# Patient Record
Sex: Male | Born: 1980 | Race: Black or African American | Hispanic: No | Marital: Single | Smoking: Never smoker
Health system: Southern US, Community
[De-identification: ages and names within clinical notes are randomized; demographics above are authoritative.]

## PROBLEM LIST (undated history)

## (undated) DIAGNOSIS — I10 Essential (primary) hypertension: Secondary | ICD-10-CM

## (undated) HISTORY — PX: SHOULDER SURGERY: SHX246

---

## 2004-03-02 ENCOUNTER — Emergency Department (HOSPITAL_COMMUNITY): Admission: EM | Admit: 2004-03-02 | Discharge: 2004-03-02 | Payer: Self-pay | Admitting: Emergency Medicine

## 2004-11-06 ENCOUNTER — Emergency Department (HOSPITAL_COMMUNITY): Admission: EM | Admit: 2004-11-06 | Discharge: 2004-11-06 | Payer: Self-pay | Admitting: Emergency Medicine

## 2010-07-07 ENCOUNTER — Emergency Department (HOSPITAL_COMMUNITY): Admission: EM | Admit: 2010-07-07 | Discharge: 2010-07-07 | Payer: Self-pay | Admitting: Emergency Medicine

## 2010-08-16 ENCOUNTER — Emergency Department (HOSPITAL_COMMUNITY): Admission: EM | Admit: 2010-08-16 | Discharge: 2010-01-08 | Payer: Self-pay | Admitting: Emergency Medicine

## 2010-11-21 LAB — POCT I-STAT, CHEM 8
Chloride: 103 mEq/L (ref 96–112)
Glucose, Bld: 94 mg/dL (ref 70–99)
HCT: 46 % (ref 39.0–52.0)
Potassium: 4.5 mEq/L (ref 3.5–5.1)
Sodium: 140 mEq/L (ref 135–145)

## 2010-11-21 LAB — CBC
HCT: 42.1 % (ref 39.0–52.0)
Hemoglobin: 14.3 g/dL (ref 13.0–17.0)
MCH: 27.8 pg (ref 26.0–34.0)
MCHC: 34 g/dL (ref 30.0–36.0)
MCV: 81.9 fL (ref 78.0–100.0)
RDW: 13 % (ref 11.5–15.5)
WBC: 5.4 10*3/uL (ref 4.0–10.5)

## 2010-11-21 LAB — URINALYSIS, ROUTINE W REFLEX MICROSCOPIC
Glucose, UA: NEGATIVE mg/dL
Hgb urine dipstick: NEGATIVE
Ketones, ur: 15 mg/dL — AB
Specific Gravity, Urine: 1.027 (ref 1.005–1.030)
pH: 8 (ref 5.0–8.0)

## 2012-09-19 ENCOUNTER — Encounter (HOSPITAL_COMMUNITY): Payer: Self-pay | Admitting: Emergency Medicine

## 2012-09-19 DIAGNOSIS — N419 Inflammatory disease of prostate, unspecified: Secondary | ICD-10-CM | POA: Insufficient documentation

## 2012-09-19 DIAGNOSIS — I10 Essential (primary) hypertension: Secondary | ICD-10-CM | POA: Insufficient documentation

## 2012-09-19 DIAGNOSIS — R350 Frequency of micturition: Secondary | ICD-10-CM | POA: Insufficient documentation

## 2012-09-19 LAB — CBC WITH DIFFERENTIAL/PLATELET
Basophils Absolute: 0 10*3/uL (ref 0.0–0.1)
Eosinophils Relative: 10 % — ABNORMAL HIGH (ref 0–5)
HCT: 40.5 % (ref 39.0–52.0)
Lymphocytes Relative: 56 % — ABNORMAL HIGH (ref 12–46)
Lymphs Abs: 2.8 10*3/uL (ref 0.7–4.0)
Monocytes Relative: 7 % (ref 3–12)
Platelets: 235 10*3/uL (ref 150–400)
RBC: 4.9 MIL/uL (ref 4.22–5.81)
WBC: 5.1 10*3/uL (ref 4.0–10.5)

## 2012-09-19 LAB — COMPREHENSIVE METABOLIC PANEL
ALT: 35 U/L (ref 0–53)
AST: 60 U/L — ABNORMAL HIGH (ref 0–37)
Albumin: 3.8 g/dL (ref 3.5–5.2)
BUN: 14 mg/dL (ref 6–23)
CO2: 29 mEq/L (ref 19–32)
Calcium: 9.6 mg/dL (ref 8.4–10.5)
Chloride: 100 mEq/L (ref 96–112)
GFR calc non Af Amer: 50 mL/min — ABNORMAL LOW (ref >90)
Total Bilirubin: 0.4 mg/dL (ref 0.3–1.2)
Total Protein: 7.3 g/dL (ref 6.0–8.3)

## 2012-09-19 LAB — URINE MICROSCOPIC-ADD ON

## 2012-09-19 LAB — URINALYSIS, ROUTINE W REFLEX MICROSCOPIC
Glucose, UA: NEGATIVE mg/dL
Hgb urine dipstick: NEGATIVE
Ketones, ur: 15 mg/dL — AB
Protein, ur: 30 mg/dL — AB

## 2012-09-19 NOTE — ED Notes (Signed)
PT. REPORTS URINARY FREQUENCY FOR SEVERAL DAYS AND  BLOOD IN STOOLS ONSET 3 WEEKS AGO.

## 2012-09-20 ENCOUNTER — Emergency Department (HOSPITAL_COMMUNITY)
Admission: EM | Admit: 2012-09-20 | Discharge: 2012-09-20 | Disposition: A | Discharge status: Home / Self Care | Payer: Managed Care, Other (non HMO) | Attending: Emergency Medicine | Admitting: Emergency Medicine

## 2012-09-20 DIAGNOSIS — N419 Inflammatory disease of prostate, unspecified: Secondary | ICD-10-CM

## 2012-09-20 HISTORY — DX: Essential (primary) hypertension: I10

## 2012-09-20 LAB — OCCULT BLOOD, POC DEVICE: Fecal Occult Bld: NEGATIVE

## 2012-09-20 MED ORDER — CIPROFLOXACIN HCL 500 MG PO TABS: 500.0000 mg | ORAL_TABLET | Freq: Two times a day (BID) | ORAL | Status: DC

## 2012-09-20 MED ORDER — IBUPROFEN 800 MG PO TABS: 800.0000 mg | ORAL_TABLET | Freq: Three times a day (TID) | ORAL | Status: DC

## 2012-09-20 MED ORDER — CIPROFLOXACIN HCL 500 MG PO TABS
500.0000 mg | ORAL_TABLET | Freq: Once | ORAL | Status: AC
  Administered 2012-09-20: 500 mg via ORAL
  Filled 2012-09-20: qty 1

## 2012-09-20 MED ORDER — SODIUM CHLORIDE 0.9 % IV BOLUS (SEPSIS)
1000.0000 mL | Freq: Once | INTRAVENOUS | Status: AC
  Administered 2012-09-20: 1000 mL via INTRAVENOUS

## 2012-09-20 NOTE — ED Provider Notes (Signed)
History     CSN: [id number]  Arrival date & time 09/19/12  2244   First MD Initiated Contact with Patient 09/20/12 0234      Chief Complaint  Patient presents with  . Hematochezia  . Urinary Frequency    (Consider location/radiation/quality/duration/timing/severity/associated sxs/prior treatment) HPI History provided by patient. The last few days has been having urinary frequency and painful bowel movements with pressure around what he describes as his prostate region. has also noticed some spotting bright red blood per rectum with wiping. No black or tarry stools. No blood thinners. No fevers or chills. No painful urination.   Patient was diagnosed with prostatitis at age 7 and did see a urologist at that time. Symptoms moderate in severity. No abdominal pain. No nausea vomiting. Has had some recent constipation but no diarrhea. Past Medical History  Diagnosis Date  . Hypertension     History reviewed. No pertinent past surgical history.  No family history on file.  History  Substance Use Topics  . Smoking status: Never Smoker   . Smokeless tobacco: Not on file  . Alcohol Use: No      Review of Systems  Constitutional: Negative for fever and chills.  HENT: Negative for neck pain and neck stiffness.   Eyes: Negative for pain.  Respiratory: Negative for shortness of breath.   Cardiovascular: Negative for chest pain.  Gastrointestinal: Positive for blood in stool. Negative for vomiting and abdominal pain.  Genitourinary: Negative for dysuria.  Musculoskeletal: Negative for back pain.  Skin: Negative for rash.  Neurological: Negative for headaches.  All other systems reviewed and are negative.    Allergies  Review of patient's allergies indicates no known allergies.  Home Medications   Current Outpatient Rx  Name  Route  Sig  Dispense  Refill  . CIPROFLOXACIN HCL 500 MG PO TABS   Oral   Take 1 tablet (500 mg total) by mouth 2 (two) times daily.   28 tablet   1   . IBUPROFEN 800 MG PO TABS   Oral   Take 1 tablet (800 mg total) by mouth 3 (three) times daily.   21 tablet   0     BP 135/66  Pulse 61  Temp 98.5 F (36.9 C) (Oral)  Resp 17  SpO2 99%  Physical Exam  Constitutional: He is oriented to person, place, and time. He appears well-developed and well-nourished.  HENT:  Head: Normocephalic and atraumatic.  Eyes: EOM are normal. Pupils are equal, round, and reactive to light.  Neck: Neck supple.  Cardiovascular: Normal rate, regular rhythm and intact distal pulses.   Pulmonary/Chest: Effort normal and breath sounds normal. No respiratory distress.  Abdominal: Soft. Bowel sounds are normal. He exhibits no distension and no mass. There is no tenderness. There is no rebound and no guarding.  Genitourinary: Guaiac negative stool.       Tender prostate, brown stool, no evidence of fissure or active bleeding, no external hemorrhoids  Musculoskeletal: Normal range of motion. He exhibits no edema.  Neurological: He is alert and oriented to person, place, and time.  Skin: Skin is warm and dry.    ED Course  Procedures (including critical care time)  Results for orders placed during the hospital encounter of 09/20/12  CBC WITH DIFFERENTIAL      Component Value Range   WBC 5.1  4.0 - 10.5 K/uL   RBC 4.90  4.22 - 5.81 MIL/uL   Hemoglobin 13.2  13.0 - 17.0 g/dL  HCT 40.5  39.0 - 52.0 %   MCV 82.7  78.0 - 100.0 fL   MCH 26.9  26.0 - 34.0 pg   MCHC 32.6  30.0 - 36.0 g/dL   RDW 14.7  82.9 - 56.2 %   Platelets 235  150 - 400 K/uL   Neutrophils Relative 26 (*) 43 - 77 %   Neutro Abs 1.3 (*) 1.7 - 7.7 K/uL   Lymphocytes Relative 56 (*) 12 - 46 %   Lymphs Abs 2.8  0.7 - 4.0 K/uL   Monocytes Relative 7  3 - 12 %   Monocytes Absolute 0.4  0.1 - 1.0 K/uL   Eosinophils Relative 10 (*) 0 - 5 %   Eosinophils Absolute 0.5  0.0 - 0.7 K/uL   Basophils Relative 1  0 - 1 %   Basophils Absolute 0.0  0.0 - 0.1 K/uL  COMPREHENSIVE METABOLIC  PANEL      Component Value Range   Sodium 138  135 - 145 mEq/L   Potassium 4.9  3.5 - 5.1 mEq/L   Chloride 100  96 - 112 mEq/L   CO2 29  19 - 32 mEq/L   Glucose, Bld 105 (*) 70 - 99 mg/dL   BUN 14  6 - 23 mg/dL   Creatinine, Ser 1.30 (*) 0.50 - 1.35 mg/dL   Calcium 9.6  8.4 - 86.5 mg/dL   Total Protein 7.3  6.0 - 8.3 g/dL   Albumin 3.8  3.5 - 5.2 g/dL   AST 60 (*) 0 - 37 U/L   ALT 35  0 - 53 U/L   Alkaline Phosphatase 73  39 - 117 U/L   Total Bilirubin 0.4  0.3 - 1.2 mg/dL   GFR calc non Af Amer 50 (*) >90 mL/min   GFR calc Af Amer 58 (*) >90 mL/min  URINALYSIS, ROUTINE W REFLEX MICROSCOPIC      Component Value Range   Color, Urine YELLOW  YELLOW   APPearance CLEAR  CLEAR   Specific Gravity, Urine 1.037 (*) 1.005 - 1.030   pH 6.5  5.0 - 8.0   Glucose, UA NEGATIVE  NEGATIVE mg/dL   Hgb urine dipstick NEGATIVE  NEGATIVE   Bilirubin Urine NEGATIVE  NEGATIVE   Ketones, ur 15 (*) NEGATIVE mg/dL   Protein, ur 30 (*) NEGATIVE mg/dL   Urobilinogen, UA 1.0  0.0 - 1.0 mg/dL   Nitrite NEGATIVE  NEGATIVE   Leukocytes, UA NEGATIVE  NEGATIVE  URINE MICROSCOPIC-ADD ON      Component Value Range   Squamous Epithelial / LPF RARE  RARE   WBC, UA 0-2  <3 WBC/hpf   RBC / HPF 0-2  <3 RBC/hpf   Bacteria, UA RARE  RARE  OCCULT BLOOD, POC DEVICE      Component Value Range   Fecal Occult Bld NEGATIVE  NEGATIVE     1. Prostatitis    IV fluids and Cipro provided  MDM   Presentation suggests prostatitis. Plan treatment with antibiotics and followup urology. Patient also given referral to GI with history of rectal bleeding.  Strict return precautions verbalizes understood. Labs urinalysis reviewed. Vital signs and nursing notes reviewed        Sunnie Nielsen, MD 09/20/12 [id number]

## 2014-04-07 ENCOUNTER — Ambulatory Visit (INDEPENDENT_AMBULATORY_CARE_PROVIDER_SITE_OTHER): Payer: Managed Care, Other (non HMO) | Admitting: Family Medicine

## 2014-04-07 VITALS — BP 140/84 | HR 75 | Temp 98.1°F | Resp 16 | Ht 75.5 in | Wt 205.2 lb

## 2014-04-07 DIAGNOSIS — Z8042 Family history of malignant neoplasm of prostate: Secondary | ICD-10-CM

## 2014-04-07 DIAGNOSIS — N509 Disorder of male genital organs, unspecified: Secondary | ICD-10-CM

## 2014-04-07 DIAGNOSIS — N410 Acute prostatitis: Secondary | ICD-10-CM

## 2014-04-07 DIAGNOSIS — N411 Chronic prostatitis: Secondary | ICD-10-CM

## 2014-04-07 DIAGNOSIS — R102 Pelvic and perineal pain unspecified side: Secondary | ICD-10-CM

## 2014-04-07 DIAGNOSIS — R300 Dysuria: Secondary | ICD-10-CM

## 2014-04-07 MED ORDER — CIPROFLOXACIN HCL 500 MG PO TABS: 500.0000 mg | ORAL_TABLET | Freq: Two times a day (BID) | ORAL | Status: DC

## 2014-04-07 NOTE — Patient Instructions (Signed)

## 2014-04-07 NOTE — Progress Notes (Signed)
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 He's had a history of prostate infections and he thinks he has one now. He's been having intermittent dysuria, incomplete emptying, perineal pain and a kind of clear discharge.  And he's single and says he is "not that sexually active: "  Patient has a family history of prostate cancer (father had it at age 35 or so)  Objective: Healthy appearing gentleman in no acute distress Genitalia: Normal circumcised male with normal testes no hernia. He's nontender in the perineum with palpation. Rectal exam reveals a boggy prostate which has no nodules.  Assessment: Acute prostatitis - Plan: ciprofloxacin (CIPRO) 500 MG tablet, GC probe amplification, urine, PSA  Perineal pain in male - Plan: PSA  Dysuria - Plan: PSA  FH: prostate cancer  Signed, Elvina SidleKurt Damoney Julia, MD

## 2014-04-09 LAB — PSA: PSA: 0.95 ng/mL (ref <=4.00)

## 2014-04-09 LAB — GC PROBE AMPLIFICATION, URINE: GC Probe Amp, Urine: NEGATIVE

## 2014-07-03 ENCOUNTER — Encounter (HOSPITAL_COMMUNITY): Payer: Self-pay | Admitting: Emergency Medicine

## 2014-07-03 ENCOUNTER — Emergency Department (HOSPITAL_COMMUNITY)
Admission: EM | Admit: 2014-07-03 | Discharge: 2014-07-03 | Disposition: A | Discharge status: Home / Self Care | Payer: Managed Care, Other (non HMO) | Attending: Emergency Medicine | Admitting: Emergency Medicine

## 2014-07-03 ENCOUNTER — Emergency Department (HOSPITAL_COMMUNITY): Payer: Managed Care, Other (non HMO)

## 2014-07-03 DIAGNOSIS — Y92410 Unspecified street and highway as the place of occurrence of the external cause: Secondary | ICD-10-CM | POA: Insufficient documentation

## 2014-07-03 DIAGNOSIS — S7001XA Contusion of right hip, initial encounter: Secondary | ICD-10-CM | POA: Diagnosis not present

## 2014-07-03 DIAGNOSIS — Y9389 Activity, other specified: Secondary | ICD-10-CM | POA: Insufficient documentation

## 2014-07-03 DIAGNOSIS — I10 Essential (primary) hypertension: Secondary | ICD-10-CM | POA: Insufficient documentation

## 2014-07-03 DIAGNOSIS — S4991XA Unspecified injury of right shoulder and upper arm, initial encounter: Secondary | ICD-10-CM | POA: Diagnosis present

## 2014-07-03 DIAGNOSIS — R52 Pain, unspecified: Secondary | ICD-10-CM

## 2014-07-03 DIAGNOSIS — T148XXA Other injury of unspecified body region, initial encounter: Secondary | ICD-10-CM

## 2014-07-03 DIAGNOSIS — S40011A Contusion of right shoulder, initial encounter: Secondary | ICD-10-CM | POA: Insufficient documentation

## 2014-07-03 DIAGNOSIS — W19XXXA Unspecified fall, initial encounter: Secondary | ICD-10-CM

## 2014-07-03 LAB — I-STAT CHEM 8, ED
BUN: 12 mg/dL (ref 6–23)
CALCIUM ION: 1.11 mmol/L — AB (ref 1.12–1.23)
CHLORIDE: 102 meq/L (ref 96–112)
CREATININE: 1.4 mg/dL — AB (ref 0.50–1.35)
GLUCOSE: 96 mg/dL (ref 70–99)
HEMATOCRIT: 43 % (ref 39.0–52.0)
HEMOGLOBIN: 14.6 g/dL (ref 13.0–17.0)
Potassium: 3.7 mEq/L (ref 3.7–5.3)
SODIUM: 139 meq/L (ref 137–147)
TCO2: 25 mmol/L (ref 0–100)

## 2014-07-03 MED ORDER — CYCLOBENZAPRINE HCL 10 MG PO TABS: 10.0000 mg | ORAL_TABLET | Freq: Two times a day (BID) | ORAL | Status: DC | PRN
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
## 2014-07-03 NOTE — ED Notes (Signed)
Pt ambulatory to room with slow, steady gait.

## 2014-07-03 NOTE — ED Notes (Addendum)
Pt sitting in chair in normal clothing. Complaining of pain 6/10 to right hip and shoulder, also left hand. Pt has steady gait and no problems with walking. No pain upon standing up.

## 2014-07-03 NOTE — ED Notes (Signed)
Pt given discharge instructions. Pt verbalized understanding.

## 2016-01-03 ENCOUNTER — Other Ambulatory Visit: Payer: Self-pay | Admitting: Orthopedic Surgery

## 2016-01-03 DIAGNOSIS — M25511 Pain in right shoulder: Secondary | ICD-10-CM

## 2016-01-10 ENCOUNTER — Ambulatory Visit
Admission: RE | Admit: 2016-01-10 | Discharge: 2016-01-10 | Disposition: A | Discharge status: Home / Self Care | Payer: Worker's Compensation | Source: Ambulatory Visit | Attending: Orthopedic Surgery | Admitting: Orthopedic Surgery

## 2016-01-10 DIAGNOSIS — M25511 Pain in right shoulder: Secondary | ICD-10-CM

## 2017-04-28 ENCOUNTER — Encounter (HOSPITAL_COMMUNITY): Payer: Self-pay

## 2017-04-28 ENCOUNTER — Inpatient Hospital Stay (HOSPITAL_COMMUNITY)
Admission: EM | Admit: 2017-04-28 | Discharge: 2017-05-01 | DRG: 443 | Disposition: A | Discharge status: Home / Self Care | Payer: No Typology Code available for payment source | Attending: General Surgery | Admitting: General Surgery

## 2017-04-28 ENCOUNTER — Inpatient Hospital Stay (HOSPITAL_COMMUNITY): Payer: No Typology Code available for payment source

## 2017-04-28 ENCOUNTER — Emergency Department (HOSPITAL_COMMUNITY): Payer: No Typology Code available for payment source

## 2017-04-28 DIAGNOSIS — I10 Essential (primary) hypertension: Secondary | ICD-10-CM | POA: Diagnosis present

## 2017-04-28 DIAGNOSIS — S5001XA Contusion of right elbow, initial encounter: Secondary | ICD-10-CM | POA: Diagnosis present

## 2017-04-28 DIAGNOSIS — S80211A Abrasion, right knee, initial encounter: Secondary | ICD-10-CM | POA: Diagnosis present

## 2017-04-28 DIAGNOSIS — S36113A Laceration of liver, unspecified degree, initial encounter: Secondary | ICD-10-CM | POA: Diagnosis present

## 2017-04-28 DIAGNOSIS — S80212A Abrasion, left knee, initial encounter: Secondary | ICD-10-CM | POA: Diagnosis present

## 2017-04-28 DIAGNOSIS — R1011 Right upper quadrant pain: Secondary | ICD-10-CM | POA: Diagnosis present

## 2017-04-28 DIAGNOSIS — R51 Headache: Secondary | ICD-10-CM | POA: Diagnosis present

## 2017-04-28 DIAGNOSIS — S60212A Contusion of left wrist, initial encounter: Secondary | ICD-10-CM | POA: Diagnosis present

## 2017-04-28 DIAGNOSIS — N289 Disorder of kidney and ureter, unspecified: Secondary | ICD-10-CM | POA: Diagnosis present

## 2017-04-28 DIAGNOSIS — T07XXXA Unspecified multiple injuries, initial encounter: Secondary | ICD-10-CM

## 2017-04-28 DIAGNOSIS — M25521 Pain in right elbow: Secondary | ICD-10-CM | POA: Diagnosis present

## 2017-04-28 DIAGNOSIS — R740 Nonspecific elevation of levels of transaminase and lactic acid dehydrogenase [LDH]: Secondary | ICD-10-CM | POA: Diagnosis present

## 2017-04-28 DIAGNOSIS — R7989 Other specified abnormal findings of blood chemistry: Secondary | ICD-10-CM | POA: Diagnosis present

## 2017-04-28 DIAGNOSIS — R0789 Other chest pain: Secondary | ICD-10-CM | POA: Diagnosis present

## 2017-04-28 DIAGNOSIS — H1131 Conjunctival hemorrhage, right eye: Secondary | ICD-10-CM | POA: Diagnosis present

## 2017-04-28 LAB — CBC
HCT: 43.1 % (ref 39.0–52.0)
Hemoglobin: 14.8 g/dL (ref 13.0–17.0)
MCH: 27.2 pg (ref 26.0–34.0)
MCHC: 34.3 g/dL (ref 30.0–36.0)
MCV: 79.2 fL (ref 78.0–100.0)
PLATELETS: 231 10*3/uL (ref 150–400)
RBC: 5.44 MIL/uL (ref 4.22–5.81)
RDW: 13.1 % (ref 11.5–15.5)
WBC: 9 10*3/uL (ref 4.0–10.5)

## 2017-04-28 LAB — COMPREHENSIVE METABOLIC PANEL
ALK PHOS: 51 U/L (ref 38–126)
ALT: 167 U/L — ABNORMAL HIGH (ref 17–63)
ANION GAP: 10 (ref 5–15)
AST: 172 U/L — AB (ref 15–41)
Albumin: 4 g/dL (ref 3.5–5.0)
BILIRUBIN TOTAL: 1.1 mg/dL (ref 0.3–1.2)
BUN: 13 mg/dL (ref 6–20)
CALCIUM: 9.1 mg/dL (ref 8.9–10.3)
CO2: 26 mmol/L (ref 22–32)
CREATININE: 1.61 mg/dL — AB (ref 0.61–1.24)
Chloride: 101 mmol/L (ref 101–111)
GFR calc Af Amer: >60 mL/min (ref >60)
GFR, EST NON AFRICAN AMERICAN: 53 mL/min — AB (ref >60)
GLUCOSE: 114 mg/dL — AB (ref 65–99)
POTASSIUM: 3.1 mmol/L — AB (ref 3.5–5.1)
Sodium: 137 mmol/L (ref 135–145)
TOTAL PROTEIN: 7.2 g/dL (ref 6.5–8.1)

## 2017-04-28 LAB — URINALYSIS, ROUTINE W REFLEX MICROSCOPIC
Bilirubin Urine: NEGATIVE
GLUCOSE, UA: NEGATIVE mg/dL
Hgb urine dipstick: NEGATIVE
KETONES UR: NEGATIVE mg/dL
LEUKOCYTES UA: NEGATIVE
NITRITE: NEGATIVE
PROTEIN: NEGATIVE mg/dL
Specific Gravity, Urine: 1.038 — ABNORMAL HIGH (ref 1.005–1.030)
pH: 7 (ref 5.0–8.0)

## 2017-04-28 LAB — TYPE AND SCREEN
ABO/RH(D): B NEG
Antibody Screen: NEGATIVE

## 2017-04-28 LAB — I-STAT CHEM 8, ED
BUN: 16 mg/dL (ref 6–20)
CALCIUM ION: 1.12 mmol/L — AB (ref 1.15–1.40)
CHLORIDE: 99 mmol/L — AB (ref 101–111)
CREATININE: 1.6 mg/dL — AB (ref 0.61–1.24)
Glucose, Bld: 115 mg/dL — ABNORMAL HIGH (ref 65–99)
HCT: 46 % (ref 39.0–52.0)
Hemoglobin: 15.6 g/dL (ref 13.0–17.0)
Potassium: 3 mmol/L — ABNORMAL LOW (ref 3.5–5.1)
Sodium: 139 mmol/L (ref 135–145)
TCO2: 26 mmol/L (ref 0–100)

## 2017-04-28 LAB — PROTIME-INR
INR: 1.06
PROTHROMBIN TIME: 13.8 s (ref 11.4–15.2)

## 2017-04-28 LAB — I-STAT CG4 LACTIC ACID, ED: LACTIC ACID, VENOUS: 3.09 mmol/L — AB (ref 0.5–1.9)

## 2017-04-28 MED ORDER — SODIUM CHLORIDE 0.9 % IV BOLUS (SEPSIS)
1000.0000 mL | Freq: Once | INTRAVENOUS | Status: AC
  Administered 2017-04-28: 1000 mL via INTRAVENOUS

## 2017-04-28 MED ORDER — IOPAMIDOL (ISOVUE-300) INJECTION 61%
INTRAVENOUS | Status: AC
  Administered 2017-04-28: 100 mL via INTRAVENOUS
  Filled 2017-04-28: qty 100
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
## 2017-04-28 NOTE — ED Notes (Signed)
Admitting at bedside 
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
## 2017-04-28 NOTE — ED Notes (Signed)
Patient taken to CT.
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
## 2017-04-29 LAB — CBC
HCT: 39.9 % (ref 39.0–52.0)
HEMATOCRIT: 40.2 % (ref 39.0–52.0)
HEMATOCRIT: 40.1 % (ref 39.0–52.0)
HEMOGLOBIN: 13.4 g/dL (ref 13.0–17.0)
Hemoglobin: 13.8 g/dL (ref 13.0–17.0)
Hemoglobin: 13.3 g/dL (ref 13.0–17.0)
MCH: 27.3 pg (ref 26.0–34.0)
MCH: 26.7 pg (ref 26.0–34.0)
MCH: 26.8 pg (ref 26.0–34.0)
MCHC: 34.3 g/dL (ref 30.0–36.0)
MCHC: 33.3 g/dL (ref 30.0–36.0)
MCHC: 33.4 g/dL (ref 30.0–36.0)
MCV: 79.4 fL (ref 78.0–100.0)
MCV: 80.1 fL (ref 78.0–100.0)
MCV: 80.2 fL (ref 78.0–100.0)
PLATELETS: 193 10*3/uL (ref 150–400)
PLATELETS: 194 10*3/uL (ref 150–400)
Platelets: 202 10*3/uL (ref 150–400)
RBC: 5.06 MIL/uL (ref 4.22–5.81)
RBC: 4.98 MIL/uL (ref 4.22–5.81)
RBC: 5 MIL/uL (ref 4.22–5.81)
RDW: 13.2 % (ref 11.5–15.5)
RDW: 13.2 % (ref 11.5–15.5)
RDW: 13.2 % (ref 11.5–15.5)
WBC: 9.3 10*3/uL (ref 4.0–10.5)
WBC: 6.9 10*3/uL (ref 4.0–10.5)
WBC: 6.3 10*3/uL (ref 4.0–10.5)

## 2017-04-29 LAB — COMPREHENSIVE METABOLIC PANEL
ALT: 175 U/L — AB (ref 17–63)
ANION GAP: 8 (ref 5–15)
AST: 183 U/L — ABNORMAL HIGH (ref 15–41)
Albumin: 3.9 g/dL (ref 3.5–5.0)
Alkaline Phosphatase: 48 U/L (ref 38–126)
BUN: 12 mg/dL (ref 6–20)
CHLORIDE: 101 mmol/L (ref 101–111)
CO2: 27 mmol/L (ref 22–32)
Calcium: 9.1 mg/dL (ref 8.9–10.3)
Creatinine, Ser: 1.51 mg/dL — ABNORMAL HIGH (ref 0.61–1.24)
GFR calc non Af Amer: 57 mL/min — ABNORMAL LOW (ref >60)
Glucose, Bld: 104 mg/dL — ABNORMAL HIGH (ref 65–99)
POTASSIUM: 3.6 mmol/L (ref 3.5–5.1)
SODIUM: 136 mmol/L (ref 135–145)
Total Bilirubin: 1.1 mg/dL (ref 0.3–1.2)
Total Protein: 6.9 g/dL (ref 6.5–8.1)

## 2017-04-29 LAB — MRSA PCR SCREENING: MRSA by PCR: NEGATIVE

## 2017-04-29 LAB — CDS SEROLOGY

## 2017-04-29 LAB — ABO/RH: ABO/RH(D): B NEG

## 2017-04-29 LAB — HIV ANTIBODY (ROUTINE TESTING W REFLEX): HIV SCREEN 4TH GENERATION: NONREACTIVE

## 2017-04-29 LAB — ETHANOL: Alcohol, Ethyl (B): <5 mg/dL (ref <5)

## 2017-04-29 MED ORDER — MORPHINE SULFATE (PF) 4 MG/ML IV SOLN: 1.0000 mg | INTRAVENOUS | Status: DC | PRN

## 2017-04-29 MED ORDER — ONDANSETRON HCL 4 MG/2ML IJ SOLN: 4.0000 mg | Freq: Four times a day (QID) | INTRAMUSCULAR | Status: DC | PRN

## 2017-04-29 MED ORDER — PANTOPRAZOLE SODIUM 40 MG IV SOLR: 40.0000 mg | Freq: Every day | INTRAVENOUS | Status: DC

## 2017-04-29 MED ORDER — POTASSIUM CHLORIDE IN NACL 20-0.9 MEQ/L-% IV SOLN
INTRAVENOUS | Status: DC
  Administered 2017-04-29 – 2017-04-30 (×2): via INTRAVENOUS
  Filled 2017-04-29 (×3): qty 1000

## 2017-04-29 MED ORDER — OXYCODONE HCL 5 MG PO TABS: 10.0000 mg | ORAL_TABLET | ORAL | Status: DC | PRN

## 2017-04-29 MED ORDER — PANTOPRAZOLE SODIUM 40 MG PO TBEC
40.0000 mg | DELAYED_RELEASE_TABLET | Freq: Every day | ORAL | Status: DC
  Administered 2017-04-29 – 2017-05-01 (×3): 40 mg via ORAL
  Filled 2017-04-29 (×3): qty 1

## 2017-04-29 MED ORDER — DOCUSATE SODIUM 100 MG PO CAPS
100.0000 mg | ORAL_CAPSULE | Freq: Two times a day (BID) | ORAL | Status: DC
  Administered 2017-04-29 – 2017-05-01 (×5): 100 mg via ORAL
  Filled 2017-04-29 (×5): qty 1

## 2017-04-29 MED ORDER — ONDANSETRON 4 MG PO TBDP: 4.0000 mg | ORAL_TABLET | Freq: Four times a day (QID) | ORAL | Status: DC | PRN

## 2017-04-29 MED ORDER — PROMETHAZINE HCL 25 MG/ML IJ SOLN: 12.5000 mg | Freq: Four times a day (QID) | INTRAMUSCULAR | Status: DC | PRN

## 2017-04-29 MED ORDER — OXYCODONE HCL 5 MG PO TABS: 5.0000 mg | ORAL_TABLET | ORAL | Status: DC | PRN

## 2017-04-29 MED ORDER — HYDRALAZINE HCL 20 MG/ML IJ SOLN: 10.0000 mg | INTRAMUSCULAR | Status: DC | PRN

## 2017-04-29 MED ORDER — DIPHENHYDRAMINE HCL 50 MG/ML IJ SOLN: 12.5000 mg | Freq: Three times a day (TID) | INTRAMUSCULAR | Status: DC | PRN

## 2017-04-29 MED ORDER — ACETAMINOPHEN 325 MG PO TABS: 650.0000 mg | ORAL_TABLET | ORAL | Status: DC | PRN
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
## 2017-04-29 NOTE — Plan of Care (Signed)
Problem: Pain Managment: Goal: General experience of comfort will improve Outcome: Progressing Discussed with patient about plan of care for the evening and pain management with some teach back displayed.  The patient gave permission to discuss lab results with his family and friends in the room.  Comments: Topics covered were about physical activity and mobility when moving in and out of the bed as well.

## 2017-04-29 NOTE — Progress Notes (Signed)
Pt is alert and oriented. Pt verbalized generalized pain & pain in neck.  Pt denies need for pain medication at this time. Pt requests ice pack for neck. Pt has ice pack applied to neck.  Family and friends have been in to visit pt this evening. Pt stood to void at bedside via urinal. Tolerated well. Call bell within reach. Will continue to monitor

## 2017-04-29 NOTE — Social Work (Signed)
CSW complete SBIRT with patient this day.  No intervention needed at this time as patient denied substance use concerns and has low risk factors.  Keene Breath, LCSW Clinical Social Worker [phone]
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
## 2017-04-30 LAB — CBC
HEMATOCRIT: 39.5 % (ref 39.0–52.0)
Hemoglobin: 13.1 g/dL (ref 13.0–17.0)
MCH: 26.7 pg (ref 26.0–34.0)
MCHC: 33.2 g/dL (ref 30.0–36.0)
MCV: 80.4 fL (ref 78.0–100.0)
PLATELETS: 191 10*3/uL (ref 150–400)
RBC: 4.91 MIL/uL (ref 4.22–5.81)
RDW: 13.2 % (ref 11.5–15.5)
WBC: 5.2 10*3/uL (ref 4.0–10.5)

## 2017-04-30 LAB — COMPREHENSIVE METABOLIC PANEL
ALBUMIN: 3.4 g/dL — AB (ref 3.5–5.0)
ALT: 113 U/L — AB (ref 17–63)
AST: 81 U/L — AB (ref 15–41)
Alkaline Phosphatase: 41 U/L (ref 38–126)
Anion gap: 6 (ref 5–15)
BUN: 10 mg/dL (ref 6–20)
CHLORIDE: 105 mmol/L (ref 101–111)
CO2: 26 mmol/L (ref 22–32)
CREATININE: 1.38 mg/dL — AB (ref 0.61–1.24)
Calcium: 8.7 mg/dL — ABNORMAL LOW (ref 8.9–10.3)
GFR calc Af Amer: >60 mL/min (ref >60)
GFR calc non Af Amer: >60 mL/min (ref >60)
GLUCOSE: 89 mg/dL (ref 65–99)
POTASSIUM: 3.8 mmol/L (ref 3.5–5.1)
Sodium: 137 mmol/L (ref 135–145)
Total Bilirubin: 1.2 mg/dL (ref 0.3–1.2)
Total Protein: 6.3 g/dL — ABNORMAL LOW (ref 6.5–8.1)

## 2017-04-30 MED ORDER — POLYETHYLENE GLYCOL 3350 17 G PO PACK
17.0000 g | PACK | Freq: Every day | ORAL | Status: DC
  Administered 2017-04-30 – 2017-05-01 (×2): 17 g via ORAL
  Filled 2017-04-30 (×2): qty 1

## 2017-04-30 MED ORDER — METHOCARBAMOL 500 MG PO TABS: 500.0000 mg | ORAL_TABLET | Freq: Three times a day (TID) | ORAL | Status: DC | PRN

## 2017-04-30 MED ORDER — MORPHINE SULFATE (PF) 4 MG/ML IV SOLN: 1.0000 mg | Freq: Four times a day (QID) | INTRAVENOUS | Status: DC | PRN

## 2017-04-30 MED ORDER — NAPHAZOLINE-GLYCERIN 0.012-0.2 % OP SOLN
1.0000 [drp] | Freq: Four times a day (QID) | OPHTHALMIC | Status: DC | PRN
  Administered 2017-04-30: 2 [drp] via OPHTHALMIC
  Filled 2017-04-30 (×2): qty 15

## 2017-04-30 NOTE — Progress Notes (Signed)
Transferred pt to 6N15 at this time.

## 2017-04-30 NOTE — Progress Notes (Signed)
Report given to 6N nurse at this time.  Pt has no s/s of any acute distress or pain/
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
## 2017-05-01 LAB — CBC
HCT: 42.7 % (ref 39.0–52.0)
Hemoglobin: 14.4 g/dL (ref 13.0–17.0)
MCH: 26.9 pg (ref 26.0–34.0)
MCHC: 33.7 g/dL (ref 30.0–36.0)
MCV: 79.7 fL (ref 78.0–100.0)
PLATELETS: 206 10*3/uL (ref 150–400)
RBC: 5.36 MIL/uL (ref 4.22–5.81)
RDW: 13 % (ref 11.5–15.5)
WBC: 5.3 10*3/uL (ref 4.0–10.5)

## 2017-05-01 LAB — COMPREHENSIVE METABOLIC PANEL
ALT: 82 U/L — AB (ref 17–63)
AST: 49 U/L — AB (ref 15–41)
Albumin: 3.6 g/dL (ref 3.5–5.0)
Alkaline Phosphatase: 44 U/L (ref 38–126)
Anion gap: 7 (ref 5–15)
BUN: 10 mg/dL (ref 6–20)
CALCIUM: 8.8 mg/dL — AB (ref 8.9–10.3)
CO2: 26 mmol/L (ref 22–32)
Chloride: 100 mmol/L — ABNORMAL LOW (ref 101–111)
Creatinine, Ser: 1.36 mg/dL — ABNORMAL HIGH (ref 0.61–1.24)
GFR calc non Af Amer: >60 mL/min (ref >60)
GLUCOSE: 90 mg/dL (ref 65–99)
POTASSIUM: 3.5 mmol/L (ref 3.5–5.1)
Sodium: 133 mmol/L — ABNORMAL LOW (ref 135–145)
TOTAL PROTEIN: 6.8 g/dL (ref 6.5–8.1)
Total Bilirubin: 1.4 mg/dL — ABNORMAL HIGH (ref 0.3–1.2)

## 2017-05-01 MED ORDER — POLYETHYLENE GLYCOL 3350 17 G PO PACK: 17.0000 g | PACK | Freq: Every day | ORAL | 0 refills | Status: DC

## 2017-05-01 MED ORDER — DIPHENHYDRAMINE HCL 12.5 MG/5ML PO ELIX: 12.5000 mg | ORAL_SOLUTION | Freq: Three times a day (TID) | ORAL | Status: DC | PRN

## 2017-05-01 MED ORDER — NAPHAZOLINE-GLYCERIN 0.012-0.2 % OP SOLN: 1.0000 [drp] | Freq: Four times a day (QID) | OPHTHALMIC | 0 refills | Status: DC | PRN

## 2017-05-01 MED ORDER — DOCUSATE SODIUM 100 MG PO CAPS: 100.0000 mg | ORAL_CAPSULE | Freq: Two times a day (BID) | ORAL | 0 refills | Status: DC

## 2017-05-01 NOTE — Progress Notes (Signed)
Patient discharge to home. Discharge medications and instructions given, verbalized understanding of instructions. No further questions by patient
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
## 2017-05-01 NOTE — Discharge Instructions (Signed)
Liver Laceration °A liver laceration is a tear or a cut in the liver. The liver is an organ that is involved in many important bodily functions. Sometimes, a liver laceration can be a very serious injury. It can cause a lot of bleeding, and surgery may be needed. Other times, a liver laceration may be minor, and bed rest may be all that is needed. Either way, treatment in a hospital is almost always required. °Liver lacerations are categorized in grades from 1 to 5. Low numbers identify lacerations that are less severe than lacerations with high numbers. °· Grade 1: This is a tear in the outer lining of the liver. It is less than ½ inch (1 cm) deep. °· Grade 2: This is a tear that is about ½ inch to 1 inch (1 to 3 cm) deep. It is less than 4 inches (10 cm) long. °· Grade 3: This is a tear that is slightly more than 1 inch (3 cm) deep. °· Grades 4 and 5: These lacerations are very deep. They affect a large part of the liver. ° °What are the causes? °This condition may be caused by: °· A forceful hit to the area around the liver (blunt trauma), such as in a car crash. Blunt trauma can tear the liver even though it does not break the skin. °· An injury in which an object goes through the skin and into the liver (penetrating injury), such as a stab or gunshot wound. ° °What are the signs or symptoms? °Common symptoms of this condition include: °· A swollen and firm abdomen. °· Pain in the abdomen. °· Tenderness when pressing on the right side of the abdomen. ° °Other symptoms include: °· Bleeding from a penetrating wound. °· Bruises on the abdomen. °· A fast heartbeat. °· Taking quick breaths. °· Feeling weak and dizzy. ° °How is this diagnosed? °To diagnose this condition, your health care provider will do a physical exam and ask about any injuries to the right side of your abdomen. You may have various tests, such as: °· Blood tests. Your blood may be tested every few hours. This will show whether you are losing  blood. °· CT scan. This test is done to check for laceration or bleeding. °· Laparoscopy. This involves placing a small camera into the abdomen and looking directly at the surface of the liver. ° °How is this treated? °Treatment depends on how deep the laceration is and how much bleeding you have. Treatment options include: °· Monitoring and bed rest at the hospital. You will have tests often. °· Receiving donated blood through an IV tube (transfusion) to replace blood that you have lost. You may need several transfusions. °· Surgery to pack gauze pads or special material around the laceration to help it heal or to repair the laceration. ° °Follow these instructions at home: °· Take over-the-counter and prescription medicines only as told by your health care provider. Do not take any other medicines unless you ask your health care provider about them first. °· Do not drive or use heavy machinery while taking prescription pain medicines. °· Rest and limit your activity as told by your health care provider. It may be several months before you can return to your usual routine. Do not participate in activities that involve physical contact or require extra energy until your health care provider approves. °· Keep all follow-up visits as told by your health care provider. This is important. °Contact a health care provider if: °· Your   abdominal pain does not go away.  You feel more weak and tired than usual. Get help right away if:  Your abdominal pain gets worse.  You have a cut on your skin that: ? Has more redness, swelling, or pain around it. ? Has more fluid or blood coming from it. ? Feels warm to the touch. ? Has pus or a bad smell coming from it.  You feel dizzy or very weak.  You have trouble breathing.  You have a fever. This information is not intended to replace advice given to you by your health care provider. Make sure you discuss any questions you have with your health care  provider. Document Released: 09/28/2010 Document Revised: 04/12/2016 Document Reviewed: 04/12/2016 Elsevier Interactive Patient Education  [employer].
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
## 2017-05-02 ENCOUNTER — Encounter (HOSPITAL_COMMUNITY): Payer: Self-pay | Admitting: Emergency Medicine

## 2017-05-05 ENCOUNTER — Emergency Department (HOSPITAL_COMMUNITY)
Admission: EM | Admit: 2017-05-05 | Discharge: 2017-05-05 | Disposition: A | Discharge status: Home / Self Care | Payer: No Typology Code available for payment source | Attending: Emergency Medicine | Admitting: Emergency Medicine

## 2017-05-05 ENCOUNTER — Encounter (HOSPITAL_COMMUNITY): Payer: Self-pay | Admitting: Emergency Medicine

## 2017-05-05 ENCOUNTER — Emergency Department (HOSPITAL_COMMUNITY): Payer: No Typology Code available for payment source

## 2017-05-05 DIAGNOSIS — I10 Essential (primary) hypertension: Secondary | ICD-10-CM | POA: Diagnosis not present

## 2017-05-05 DIAGNOSIS — R319 Hematuria, unspecified: Secondary | ICD-10-CM | POA: Diagnosis present

## 2017-05-05 DIAGNOSIS — Z79899 Other long term (current) drug therapy: Secondary | ICD-10-CM | POA: Insufficient documentation

## 2017-05-05 LAB — COMPREHENSIVE METABOLIC PANEL
ALT: 39 U/L (ref 17–63)
AST: 45 U/L — ABNORMAL HIGH (ref 15–41)
Albumin: 3.8 g/dL (ref 3.5–5.0)
Alkaline Phosphatase: 51 U/L (ref 38–126)
Anion gap: 5 (ref 5–15)
BUN: 7 mg/dL (ref 6–20)
CHLORIDE: 101 mmol/L (ref 101–111)
CO2: 29 mmol/L (ref 22–32)
Calcium: 9.1 mg/dL (ref 8.9–10.3)
Creatinine, Ser: 1.3 mg/dL — ABNORMAL HIGH (ref 0.61–1.24)
Glucose, Bld: 93 mg/dL (ref 65–99)
POTASSIUM: 4.1 mmol/L (ref 3.5–5.1)
Sodium: 135 mmol/L (ref 135–145)
Total Bilirubin: 0.7 mg/dL (ref 0.3–1.2)
Total Protein: 7 g/dL (ref 6.5–8.1)

## 2017-05-05 LAB — URINALYSIS, ROUTINE W REFLEX MICROSCOPIC
BILIRUBIN URINE: NEGATIVE
GLUCOSE, UA: NEGATIVE mg/dL
HGB URINE DIPSTICK: NEGATIVE
Ketones, ur: NEGATIVE mg/dL
Leukocytes, UA: NEGATIVE
Nitrite: NEGATIVE
Protein, ur: NEGATIVE mg/dL
SPECIFIC GRAVITY, URINE: 1.006 (ref 1.005–1.030)
pH: 7 (ref 5.0–8.0)

## 2017-05-05 LAB — CBC
HCT: 41.2 % (ref 39.0–52.0)
Hemoglobin: 14 g/dL (ref 13.0–17.0)
MCH: 27.4 pg (ref 26.0–34.0)
MCHC: 34 g/dL (ref 30.0–36.0)
MCV: 80.6 fL (ref 78.0–100.0)
PLATELETS: 214 10*3/uL (ref 150–400)
RBC: 5.11 MIL/uL (ref 4.22–5.81)
RDW: 12.9 % (ref 11.5–15.5)
WBC: 4.6 10*3/uL (ref 4.0–10.5)

## 2017-05-05 MED ORDER — IOPAMIDOL (ISOVUE-300) INJECTION 61%
INTRAVENOUS | Status: AC
  Administered 2017-05-05: 100 mL via INTRAVENOUS
  Filled 2017-05-05: qty 100

## 2017-05-05 NOTE — Discharge Instructions (Signed)
No blood was seen in your urine here. CT appears stable from prior. Please do activity only as previously instructed by trauma surgeons on discharge. Keep your follow-up appointment with trauma. Return here if you're worse at any time especially any change in abdominal pain, return of bleeding, or lightheadedness.
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
## 2017-05-05 NOTE — ED Notes (Signed)
Patient transported to CT 
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
## 2017-05-12 ENCOUNTER — Emergency Department (HOSPITAL_COMMUNITY): Payer: No Typology Code available for payment source

## 2017-05-12 ENCOUNTER — Emergency Department (HOSPITAL_COMMUNITY)
Admission: EM | Admit: 2017-05-12 | Discharge: 2017-05-12 | Disposition: A | Discharge status: Home / Self Care | Payer: No Typology Code available for payment source | Attending: Emergency Medicine | Admitting: Emergency Medicine

## 2017-05-12 ENCOUNTER — Encounter (HOSPITAL_COMMUNITY): Payer: Self-pay

## 2017-05-12 DIAGNOSIS — M25532 Pain in left wrist: Secondary | ICD-10-CM

## 2017-05-12 DIAGNOSIS — Z79899 Other long term (current) drug therapy: Secondary | ICD-10-CM | POA: Insufficient documentation

## 2017-05-12 DIAGNOSIS — I10 Essential (primary) hypertension: Secondary | ICD-10-CM | POA: Diagnosis not present
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
## 2017-05-12 NOTE — ED Notes (Signed)
See edp assessment 
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
## 2017-05-12 NOTE — ED Notes (Signed)
MD Jacubowitz at the bedside   

## 2017-05-12 NOTE — ED Notes (Signed)
PA at the bedside.

## 2017-05-12 NOTE — ED Notes (Signed)
Patient transported to X-ray 

## 2017-05-22 ENCOUNTER — Encounter: Payer: Self-pay | Admitting: *Deleted

## 2017-05-22 ENCOUNTER — Ambulatory Visit (INDEPENDENT_AMBULATORY_CARE_PROVIDER_SITE_OTHER): Payer: Self-pay | Admitting: Neurology

## 2017-05-22 VITALS — BP 154/82 | HR 93 | Ht 75.0 in | Wt 207.8 lb

## 2017-05-22 DIAGNOSIS — F0781 Postconcussional syndrome: Secondary | ICD-10-CM

## 2017-05-22 NOTE — Patient Instructions (Signed)
Post-Concussion Syndrome Post-concussion syndrome describes the symptoms that can occur after a head injury. These symptoms can last from weeks to months. What are the causes? It is not clear why some head injuries cause post-concussion syndrome. It can occur whether your head injury was mild or severe and whether you were wearing head protection or not. What are the signs or symptoms?  Memory difficulties.  Dizziness.  Headaches.  Double vision or blurry vision.  Sensitivity to light.  Hearing difficulties.  Depression.  Tiredness.  Weakness.  Difficulty with concentration.  Difficulty sleeping or staying asleep.  Vomiting.  Poor balance or instability on your feet.  Slow reaction time.  Difficulty learning and remembering things you have heard. How is this diagnosed? There is no test to determine whether you have post-concussion syndrome. Your health care provider may order an imaging scan of your brain, such as a CT scan, to check for other problems that may be causing your symptoms (such as a severe injury inside your skull). How is this treated? Usually, these problems disappear over time without medical care. Your health care provider may prescribe medicine to help ease your symptoms. It is important to follow up with a neurologist to evaluate your recovery and address any lingering symptoms or issues. Follow these instructions at home:  Take medicines only as directed by your health care provider. Do not take aspirin. Aspirin can slow blood clotting.  Sleep with your head slightly elevated to help with headaches.  Avoid any situation where there is potential for another head injury. This includes football, hockey, soccer, basketball, martial arts, downhill snow sports, and horseback riding. Your condition will get worse every time you experience a concussion. You should avoid these activities until you are evaluated by the appropriate follow-up health care  providers.  Keep all follow-up visits as directed by your health care provider. This is important. Contact a health care provider if:  You have increased problems paying attention or concentrating.  You have increased difficulty remembering or learning new information.  You need more time to complete tasks or assignments than before.  You have increased irritability or decreased ability to cope with stress.  You have more symptoms than before. Seek medical care if you have any of the following symptoms for more than two weeks after your injury:  Lasting (chronic) headaches.  Dizziness or balance problems.  Nausea.  Vision problems.  Increased sensitivity to noise or light.  Depression or mood swings.  Anxiety or irritability.  Memory problems.  Difficulty concentrating or paying attention.  Sleep problems.  Feeling tired all the time.  Get help right away if:  You have confusion or unusual drowsiness.  Others find it difficult to wake you up.  You have nausea or persistent, forceful vomiting.  You feel like you are moving when you are not (vertigo). Your eyes may move rapidly back and forth.  You have convulsions or faint.  You have severe, persistent headaches that are not relieved by medicine.  You cannot use your arms or legs normally.  One of your pupils is larger than the other.  You have clear or bloody discharge from your nose or ears.  Your problems are getting worse, not better. This information is not intended to replace advice given to you by your health care provider. Make sure you discuss any questions you have with your health care provider. Document Released: 02/15/2002 Document Revised: 03/15/2016 Document Reviewed: 12/01/2013 Elsevier Interactive Patient Education  2018 [employer]  
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
## 2017-06-23 ENCOUNTER — Encounter (HOSPITAL_COMMUNITY): Payer: Self-pay | Admitting: *Deleted

## 2017-06-23 ENCOUNTER — Emergency Department (HOSPITAL_COMMUNITY)
Admission: EM | Admit: 2017-06-23 | Discharge: 2017-06-23 | Disposition: A | Discharge status: Home / Self Care | Payer: No Typology Code available for payment source | Attending: Emergency Medicine | Admitting: Emergency Medicine

## 2017-06-23 DIAGNOSIS — M25532 Pain in left wrist: Secondary | ICD-10-CM

## 2017-06-23 DIAGNOSIS — I10 Essential (primary) hypertension: Secondary | ICD-10-CM | POA: Diagnosis not present

## 2017-06-23 DIAGNOSIS — M79642 Pain in left hand: Secondary | ICD-10-CM | POA: Insufficient documentation

## 2017-06-23 MED ORDER — IBUPROFEN 800 MG PO TABS
800.0000 mg | ORAL_TABLET | Freq: Once | ORAL | Status: AC
  Administered 2017-06-23: 800 mg via ORAL
  Filled 2017-06-23: qty 1
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
## 2017-06-23 NOTE — ED Notes (Signed)
Ortho tech coming to splint wrist.

## 2017-06-23 NOTE — ED Notes (Signed)
Ortho tech paged  

## 2017-06-23 NOTE — ED Notes (Signed)
Paged ortho tech X2.  

## 2017-06-23 NOTE — Discharge Instructions (Signed)
Continue to use wrist splint. Take ibuprofen for pain, you can use heat as well. Use wrist exercises provided. Provided information for several different hand specialist in orthopedic clinic's, please call these offices and explain your situation and try to get an appointment. You can also try going through the trauma clinic or [facility] to see if they can help facilitate follow-up for this wrist problem. If pain gets worse, your han becomes numb or pale please return to the emergency department for re-evaluation.
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
## 2017-06-23 NOTE — Progress Notes (Signed)
Orthopedic Tech Progress Note Patient Details:  Gary Miller 1980/12/20 [id number]  Ortho Devices Type of Ortho Device: Wrist splint Ortho Device/Splint Interventions: Application   Saul Fordyce 06/23/2017, 2:10 PM

## 2017-06-24 ENCOUNTER — Encounter (INDEPENDENT_AMBULATORY_CARE_PROVIDER_SITE_OTHER): Payer: Self-pay | Admitting: Orthopaedic Surgery

## 2017-06-24 ENCOUNTER — Ambulatory Visit (INDEPENDENT_AMBULATORY_CARE_PROVIDER_SITE_OTHER): Payer: No Typology Code available for payment source | Admitting: Orthopaedic Surgery

## 2017-06-24 ENCOUNTER — Ambulatory Visit (INDEPENDENT_AMBULATORY_CARE_PROVIDER_SITE_OTHER): Payer: Self-pay | Admitting: Orthopaedic Surgery

## 2017-06-24 DIAGNOSIS — M25532 Pain in left wrist: Secondary | ICD-10-CM
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
## 2017-07-06 ENCOUNTER — Ambulatory Visit
Admission: RE | Admit: 2017-07-06 | Discharge: 2017-07-06 | Disposition: A | Discharge status: Home / Self Care | Payer: Self-pay | Source: Ambulatory Visit | Attending: Orthopaedic Surgery | Admitting: Orthopaedic Surgery

## 2017-07-06 ENCOUNTER — Other Ambulatory Visit: Payer: Self-pay

## 2017-07-06 DIAGNOSIS — M25532 Pain in left wrist: Secondary | ICD-10-CM

## 2017-07-08 ENCOUNTER — Ambulatory Visit (INDEPENDENT_AMBULATORY_CARE_PROVIDER_SITE_OTHER): Payer: Self-pay | Admitting: Orthopaedic Surgery

## 2017-07-08 DIAGNOSIS — M24132 Other articular cartilage disorders, left wrist: Secondary | ICD-10-CM

## 2017-07-08 DIAGNOSIS — M25532 Pain in left wrist: Secondary | ICD-10-CM | POA: Insufficient documentation

## 2017-07-08 MED ORDER — METHYLPREDNISOLONE ACETATE 40 MG/ML IJ SUSP
13.3300 mg | INTRAMUSCULAR | Status: AC | PRN
  Administered 2017-07-08: 13.33 mg via INTRA_ARTICULAR

## 2017-07-08 MED ORDER — BUPIVACAINE HCL 0.5 % IJ SOLN
1.0000 mL | INTRAMUSCULAR | Status: AC | PRN
  Administered 2017-07-08: 1 mL via INTRA_ARTICULAR

## 2017-07-08 MED ORDER — LIDOCAINE HCL 1 % IJ SOLN
1.0000 mL | INTRAMUSCULAR | Status: AC | PRN
  Administered 2017-07-08: 1 mL

## 2017-07-08 NOTE — Progress Notes (Signed)
   Office Visit Note   Patient: Gary CalkinsSteven F Miller           Date of Birth: 1980/12/02           MRN: [id number] Visit Date: 07/08/2017              Requested by: William DaltonVang, Tuber, MD No address on file PCP: William DaltonVang, Tuber, MD   Assessment & Plan: Visit Diagnoses:  1. Degenerative TFCC tear, left     Plan: MRI findings are consistent with a degenerative central TFCC tear and effusion of the DRUJ.  I performed a wrist injection today.  I want him to discontinue the wrist brace.  Referral to hand therapy.  Questions encouraged and answered.  Follow-up as needed.  Follow-Up Instructions: Return if symptoms worsen or fail to improve.   Orders:  Orders Placed This Encounter  Procedures  . Medium Joint Injection/Arthrocentesis   No orders of the defined types were placed in this encounter.     Procedures: Medium Joint Inj Date/Time: 07/08/2017 8:47 AM Performed by: Tarry KosXU, Leea Rambeau M Authorized by: Tarry KosXU, Lorilyn Laitinen M   Indications:  Pain Location:  Wrist Site:  L radiocarpal Needle Size:  25 G Approach:  Dorsal Ultrasound Guided: No   Fluoroscopic Guidance: No   Medications:  1 mL lidocaine 1 %; 13.33 mg methylPREDNISolone acetate 40 MG/ML; 1 mL bupivacaine 0.5 %     Clinical Data: No additional findings.   Subjective: No chief complaint on file.   Patient is here to review his MRI.  He continues to have left wrist pain and stiffness.    Review of Systems  Constitutional: Negative.   All other systems reviewed and are negative.    Objective: Vital Signs: There were no vitals taken for this visit.  Physical Exam  Constitutional: He is oriented to person, place, and time. He appears well-developed and well-nourished.  Pulmonary/Chest: Effort normal.  Abdominal: Soft.  Neurological: He is alert and oriented to person, place, and time.  Skin: Skin is warm.  Psychiatric: He has a normal mood and affect. His behavior is normal. Judgment and thought content normal.  Nursing  note and vitals reviewed.   Ortho Exam Left wrist exam shows pain with palpation of the TFCC, pain with supination of the wrist and compression of the DRUJ Specialty Comments:  No specialty comments available.  Imaging: No results found.   PMFS History: Patient Active Problem List   Diagnosis Date Noted  . Pain in left wrist 07/08/2017  . Liver laceration 04/28/2017  . Prostatitis, chronic 04/07/2014  . FHx: prostate cancer 04/07/2014   Past Medical History:  Diagnosis Date  . Hypertension     Family History  Problem Relation Age of Onset  . Hypertension Mother   . Hypertension Father   . Hyperlipidemia Father   . Cancer Father   . Hypertension Maternal Grandmother   . Hypertension Paternal Grandmother     Past Surgical History:  Procedure Laterality Date  . SHOULDER SURGERY Right    Social History   Occupational History  . Not on file.   Social History Main Topics  . Smoking status: Never Smoker  . Smokeless tobacco: Never Used  . Alcohol use Yes     Comment: occasional  . Drug use: No  . Sexual activity: Yes

## 2017-07-09 ENCOUNTER — Ambulatory Visit: Payer: No Typology Code available for payment source | Attending: Orthopaedic Surgery | Admitting: Occupational Therapy

## 2017-07-09 DIAGNOSIS — M25642 Stiffness of left hand, not elsewhere classified: Secondary | ICD-10-CM | POA: Diagnosis present

## 2017-07-09 DIAGNOSIS — M25632 Stiffness of left wrist, not elsewhere classified: Secondary | ICD-10-CM | POA: Diagnosis present

## 2017-07-09 DIAGNOSIS — M25532 Pain in left wrist: Secondary | ICD-10-CM | POA: Insufficient documentation

## 2017-07-09 DIAGNOSIS — M6281 Muscle weakness (generalized): Secondary | ICD-10-CM | POA: Diagnosis present
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
## 2017-07-10 ENCOUNTER — Ambulatory Visit: Payer: No Typology Code available for payment source | Attending: Orthopaedic Surgery | Admitting: Occupational Therapy

## 2017-07-10 DIAGNOSIS — M25632 Stiffness of left wrist, not elsewhere classified: Secondary | ICD-10-CM | POA: Insufficient documentation

## 2017-07-10 DIAGNOSIS — M25532 Pain in left wrist: Secondary | ICD-10-CM | POA: Diagnosis present

## 2017-07-10 DIAGNOSIS — M25642 Stiffness of left hand, not elsewhere classified: Secondary | ICD-10-CM | POA: Insufficient documentation

## 2017-07-10 DIAGNOSIS — M6281 Muscle weakness (generalized): Secondary | ICD-10-CM | POA: Diagnosis present
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
## 2017-07-10 NOTE — Patient Instructions (Signed)
Flexor Tendon Gliding (Active Hook Fist)   With fingers and knuckles straight, bend middle and tip joints. Do not bend large knuckles. Repeat _10-15___ times. Do _4-6___ sessions per day.  MP Flexion (Active)   With back of hand on table, bend large knuckles as far as they will go, keeping small joints straight. Repeat _10-15___ times. Do __4-6__ sessions per day. Activity: Reach into a narrow container.*      Finger Flexion / Extension   With palm up, bend fingers of left hand toward palm, making a  fist. Straighten fingers, opening fist. Repeat sequence _10-15___ times per session. Do _4-6__ sessions per day. Hand Variation: Palm down   Copyright  VHI. All rights reserved.  AROM: Wrist Extension   .  With ____ palm down, bend wrist up. Repeat __15__ times per set.  Do __4-6__ sessions per day.       AROM: Forearm Pronation / Supination   With _left___ arm in handshake position, slowly rotate palm down until stretch is felt. Relax. Then rotate palm up until stretch is felt. Repeat _15___ times per set. Do _4-6___ sessions per day.  Copyright  VHI. All rights reserved.    Opposition (Active)   Touch tip of thumb to nail tip of each finger in turn, making an "O" shape. Repeat __10__ times. Do _4-6___ sessions per day.   MP Flexion (Active)   Bend thumb to touch base of little finger, keeping tip joint straight. Repeat __10-15__ times. Do _4-6___ sessions per day.       IP Flexion (Active Blocked)   Brace thumb below tip joint. Bend joint as far as possible. Repeat __10__ times. Do _4-6___ sessions per day.   Composite Extension (Active)   Bring thumb up and out in hitchhiker position.  Repeat __10-15__ times. Do _4-6___ sessions per day.

## 2017-07-14 ENCOUNTER — Ambulatory Visit: Payer: No Typology Code available for payment source | Admitting: Occupational Therapy

## 2017-07-14 DIAGNOSIS — M25642 Stiffness of left hand, not elsewhere classified: Secondary | ICD-10-CM

## 2017-07-14 DIAGNOSIS — M25532 Pain in left wrist: Secondary | ICD-10-CM | POA: Diagnosis not present

## 2017-07-14 DIAGNOSIS — M25632 Stiffness of left wrist, not elsewhere classified: Secondary | ICD-10-CM
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
## 2017-07-16 ENCOUNTER — Ambulatory Visit: Payer: No Typology Code available for payment source | Admitting: Occupational Therapy

## 2017-07-16 DIAGNOSIS — M25632 Stiffness of left wrist, not elsewhere classified: Secondary | ICD-10-CM

## 2017-07-16 DIAGNOSIS — M6281 Muscle weakness (generalized): Secondary | ICD-10-CM

## 2017-07-16 DIAGNOSIS — M25532 Pain in left wrist: Secondary | ICD-10-CM | POA: Diagnosis not present

## 2017-07-16 DIAGNOSIS — M25642 Stiffness of left hand, not elsewhere classified: Secondary | ICD-10-CM

## 2017-07-16 NOTE — Patient Instructions (Signed)
  Extension (Resistive)    With wrist over edge of table, lift __2__ lb., keeping arm on table surface. Hold __3__ seconds. Lower slowly. Repeat __15__ times. Do __2__ sessions per day.   Flexion (Resistive)    With hand palm-up and holding __2__ lbs, bend hand toward you at wrist. Hold __3__ seconds. Relax slowly. Repeat _15___ times. Do __2__ sessions per day.      1. Grip Strengthening (Resistive Putty)   Squeeze putty using thumb and all fingers. Repeat _20___ times. Do __2__ sessions per day.   2. MP Flexion (Resistive Putty)    Bending only at large knuckles, press putty down against thumb. Keep fingertips straight, then pull apart. Repeat __10__ times. Do __2__ sessions per day.     3. Roll putty into tube on table and pinch between each finger and thumb x 10 reps each. (Do ring and small finger together). 2 sessions per day.
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
## 2017-07-22 ENCOUNTER — Ambulatory Visit: Payer: No Typology Code available for payment source | Admitting: Occupational Therapy

## 2017-07-22 DIAGNOSIS — M25632 Stiffness of left wrist, not elsewhere classified: Secondary | ICD-10-CM

## 2017-07-22 DIAGNOSIS — M6281 Muscle weakness (generalized): Secondary | ICD-10-CM

## 2017-07-22 DIAGNOSIS — M25532 Pain in left wrist: Secondary | ICD-10-CM | POA: Diagnosis not present

## 2017-07-22 DIAGNOSIS — M25642 Stiffness of left hand, not elsewhere classified: Secondary | ICD-10-CM
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
## 2017-07-24 ENCOUNTER — Ambulatory Visit: Payer: No Typology Code available for payment source | Admitting: Occupational Therapy

## 2017-07-28 ENCOUNTER — Ambulatory Visit: Payer: No Typology Code available for payment source | Admitting: Occupational Therapy

## 2017-07-28 DIAGNOSIS — M6281 Muscle weakness (generalized): Secondary | ICD-10-CM

## 2017-07-28 DIAGNOSIS — M25642 Stiffness of left hand, not elsewhere classified: Secondary | ICD-10-CM

## 2017-07-28 DIAGNOSIS — M25532 Pain in left wrist: Secondary | ICD-10-CM | POA: Diagnosis not present

## 2017-07-28 DIAGNOSIS — M25632 Stiffness of left wrist, not elsewhere classified: Secondary | ICD-10-CM
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
## 2017-07-29 ENCOUNTER — Ambulatory Visit: Payer: No Typology Code available for payment source | Admitting: Occupational Therapy

## 2017-07-29 DIAGNOSIS — M6281 Muscle weakness (generalized): Secondary | ICD-10-CM

## 2017-07-29 DIAGNOSIS — M25642 Stiffness of left hand, not elsewhere classified: Secondary | ICD-10-CM

## 2017-07-29 DIAGNOSIS — M25632 Stiffness of left wrist, not elsewhere classified: Secondary | ICD-10-CM

## 2017-07-29 DIAGNOSIS — M25532 Pain in left wrist: Secondary | ICD-10-CM | POA: Diagnosis not present
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
## 2017-08-02 ENCOUNTER — Emergency Department (HOSPITAL_COMMUNITY)
Admission: EM | Admit: 2017-08-02 | Discharge: 2017-08-02 | Disposition: A | Discharge status: Home / Self Care | Payer: Self-pay | Attending: Emergency Medicine | Admitting: Emergency Medicine

## 2017-08-02 ENCOUNTER — Encounter (HOSPITAL_COMMUNITY): Payer: Self-pay

## 2017-08-02 ENCOUNTER — Other Ambulatory Visit: Payer: Self-pay

## 2017-08-02 DIAGNOSIS — R51 Headache: Secondary | ICD-10-CM | POA: Insufficient documentation

## 2017-08-02 DIAGNOSIS — J320 Chronic maxillary sinusitis: Secondary | ICD-10-CM | POA: Insufficient documentation

## 2017-08-02 DIAGNOSIS — I10 Essential (primary) hypertension: Secondary | ICD-10-CM | POA: Insufficient documentation

## 2017-08-02 DIAGNOSIS — K089 Disorder of teeth and supporting structures, unspecified: Secondary | ICD-10-CM | POA: Insufficient documentation

## 2017-08-02 MED ORDER — PSEUDOEPHEDRINE HCL 30 MG PO TABS: 30.0000 mg | ORAL_TABLET | Freq: Four times a day (QID) | ORAL | 0 refills | Status: AC | PRN

## 2017-08-02 MED ORDER — IBUPROFEN 600 MG PO TABS: 600.0000 mg | ORAL_TABLET | Freq: Four times a day (QID) | ORAL | 0 refills | Status: AC | PRN

## 2017-08-02 MED ORDER — AMOXICILLIN-POT CLAVULANATE 875-125 MG PO TABS: 1.0000 | ORAL_TABLET | Freq: Two times a day (BID) | ORAL | 0 refills | Status: DC

## 2017-08-02 NOTE — ED Provider Notes (Signed)
[facility] EMERGENC[facility] Provider Note   CSN: [id number] Arrival date & time: 08/02/17  1113     History   Chief Complaint No chief complaint on file.   HPI Gary Miller is a 36 y.o. male who presents to the ED for facial pain. The pain is located on the left side of the face under the eye and patient has the feeling that his teeth on that side are hurting he has noted that the area under his left eye is puffy.   HPI  Past Medical History:  Diagnosis Date  . Hypertension     Patient Active Problem List   Diagnosis Date Noted  . Pain in left wrist 07/08/2017  . Liver laceration 04/28/2017  . Prostatitis, chronic 04/07/2014  . FHx: prostate cancer 04/07/2014    Past Surgical History:  Procedure Laterality Date  . SHOULDER SURGERY Right        Home Medications    Prior to Admission medications   Medication Sig Start Date End Date Taking? Authorizing Provider  amoxicillin-clavulanate (AUGMENTIN) 875-125 MG tablet Take 1 tablet by mouth 2 (two) times daily. 08/02/17   Janne NapoleonNeese, Kandyce Dieguez M, NP  ibuprofen (ADVIL,MOTRIN) 600 MG tablet Take 1 tablet (600 mg total) by mouth every 6 (six) hours as needed. 08/02/17   Janne NapoleonNeese, Marialy Urbanczyk M, NP  pseudoephedrine (SUDAFED) 30 MG tablet Take 1 tablet (30 mg total) by mouth every 6 (six) hours as needed for congestion. 08/02/17   Janne NapoleonNeese, Tremel Setters M, NP    Family History Family History  Problem Relation Age of Onset  . Hypertension Mother   . Hypertension Father   . Hyperlipidemia Father   . Cancer Father   . Hypertension Maternal Grandmother   . Hypertension Paternal Grandmother     Social History Social History   Tobacco Use  . Smoking status: Never Smoker  . Smokeless tobacco: Never Used  Substance Use Topics  . Alcohol use: Yes    Comment: occasional  . Drug use: No     Allergies   Patient has no known allergies.   Review of Systems Review of Systems  Constitutional: Positive for chills.  Negative for fever.  HENT: Positive for congestion, dental problem and sinus pressure.   Eyes: Negative for visual disturbance.  Respiratory: Negative for cough and shortness of breath.   Cardiovascular: Negative for chest pain.  Gastrointestinal: Negative for abdominal pain and nausea.  Musculoskeletal: Negative for back pain and neck stiffness.  Skin: Negative for wound.  Neurological: Positive for headaches. Negative for syncope and weakness.  Psychiatric/Behavioral: Negative for confusion.     Physical Exam Updated Vital Signs BP (!) 167/59   Pulse 71   Temp 98 F (36.7 C) (Oral)   Resp 16   SpO2 99%   Physical Exam  Constitutional: He is oriented to person, place, and time. He appears well-developed and well-nourished. No distress.  HENT:  Right Ear: Tympanic membrane normal.  Left Ear: Tympanic membrane normal.  Nose: Mucosal edema present. Left sinus exhibits maxillary sinus tenderness.  Mouth/Throat: Uvula is midline and mucous membranes are normal. Abnormal dentition. Posterior oropharyngeal erythema present.    Tender over the left lateral incisor and tender over the left maxillary sinus area.   Eyes: EOM are normal.  Neck: Neck supple.  Cardiovascular: Normal rate and regular rhythm.  Pulmonary/Chest: Effort normal and breath sounds normal.  Abdominal: Soft. There is no tenderness.  Musculoskeletal: Normal range of motion.  Neurological: He is alert  and oriented to person, place, and time. No cranial nerve deficit.  Skin: Skin is warm and dry.  Psychiatric: He has a normal mood and affect. His behavior is normal.  Nursing note and vitals reviewed.    ED Treatments / Results  Labs (all labs ordered are listed, but only abnormal results are displayed) Labs Reviewed - No data to display  Radiology No results found.  Procedures Procedures (including critical care time)  Medications Ordered in ED Medications - No data to display   Initial Impression /  Assessment and Plan / ED Course  I have reviewed the triage vital signs and the nursing notes. 36 y.o. male with visit for left sinus pain and ? Dental pain and "puffy around left eye", stable for d/c without fever and does not appear toxic. Will treat for sinusitis and patient to f/u with PCP. Return precautions discussed.   Final Clinical Impressions(s) / ED Diagnoses   Final diagnoses:  Left maxillary sinusitis    ED Discharge Orders        Ordered    amoxicillin-clavulanate (AUGMENTIN) 875-125 MG tablet  2 times daily     08/02/17 1300    pseudoephedrine (SUDAFED) 30 MG tablet  Every 6 hours PRN     08/02/17 1300    ibuprofen (ADVIL,MOTRIN) 600 MG tablet  Every 6 hours PRN     08/02/17 1300       Damian Leavelleese, [place] M, NP 08/04/17 1043    Raeford RazorKohut, Stephen, MD 08/04/17 1228

## 2017-08-02 NOTE — ED Notes (Signed)
Declined W/C at D/C and was escorted to lobby by RN. 

## 2017-08-02 NOTE — ED Triage Notes (Signed)
Patient complains of left sided facial and gum pain x 2 days, took ibuprofen with some relief, denies fever.

## 2017-08-05 ENCOUNTER — Encounter: Payer: Self-pay | Admitting: Occupational Therapy

## 2017-08-07 ENCOUNTER — Encounter: Payer: Self-pay | Admitting: Occupational Therapy

## 2017-08-12 ENCOUNTER — Encounter: Payer: Self-pay | Admitting: Occupational Therapy

## 2017-08-13 ENCOUNTER — Other Ambulatory Visit: Payer: Self-pay

## 2017-08-13 ENCOUNTER — Ambulatory Visit: Payer: Self-pay | Admitting: Physician Assistant

## 2017-08-13 ENCOUNTER — Encounter: Payer: Self-pay | Admitting: Physician Assistant

## 2017-08-13 VITALS — BP 148/88 | HR 84 | Temp 97.8°F | Resp 16 | Ht 74.41 in | Wt 211.2 lb

## 2017-08-13 DIAGNOSIS — R7989 Other specified abnormal findings of blood chemistry: Secondary | ICD-10-CM

## 2017-08-13 DIAGNOSIS — S36113A Laceration of liver, unspecified degree, initial encounter: Secondary | ICD-10-CM

## 2017-08-13 DIAGNOSIS — Z0000 Encounter for general adult medical examination without abnormal findings: Secondary | ICD-10-CM

## 2017-08-13 DIAGNOSIS — Z7251 High risk heterosexual behavior: Secondary | ICD-10-CM

## 2017-08-13 DIAGNOSIS — Z8042 Family history of malignant neoplasm of prostate: Secondary | ICD-10-CM
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
## 2017-08-13 NOTE — Patient Instructions (Addendum)
  I will contact you regarding your lab results via mychart. Remember to return the urine   IF you received an x-ray today, you will receive an invoice from [facility]. Please contact [facility] at [phone] with questions or concerns regarding your invoice.   IF you received labwork today, you will receive an invoice from [facility]. Please contact [facility] at [phone] with questions or concerns regarding your invoice.   Our billing staff will not be able to assist you with questions regarding bills from these companies.  You will be contacted with the lab results as soon as they are available. The fastest way to get your results is to activate your My Chart account. Instructions are located on the last page of this paperwork. If you have not heard from us regarding the results in 2 weeks, please contact this office.

## 2017-08-14 ENCOUNTER — Encounter: Payer: Self-pay | Admitting: Occupational Therapy

## 2017-08-14 LAB — CMP14+EGFR
A/G RATIO: 1.5 (ref 1.2–2.2)
ALT: 26 IU/L (ref 0–44)
AST: 26 IU/L (ref 0–40)
Albumin: 4.7 g/dL (ref 3.5–5.5)
Alkaline Phosphatase: 72 IU/L (ref 39–117)
BILIRUBIN TOTAL: 0.3 mg/dL (ref 0.0–1.2)
BUN/Creatinine Ratio: 8 — ABNORMAL LOW (ref 9–20)
BUN: 10 mg/dL (ref 6–20)
CALCIUM: 9.6 mg/dL (ref 8.7–10.2)
CHLORIDE: 103 mmol/L (ref 96–106)
CO2: 24 mmol/L (ref 20–29)
Creatinine, Ser: 1.24 mg/dL (ref 0.76–1.27)
GFR calc Af Amer: 85 mL/min/{1.73_m2} (ref >59)
GFR, EST NON AFRICAN AMERICAN: 73 mL/min/{1.73_m2} (ref >59)
GLOBULIN, TOTAL: 3.1 g/dL (ref 1.5–4.5)
Glucose: 101 mg/dL — ABNORMAL HIGH (ref 65–99)
POTASSIUM: 4.5 mmol/L (ref 3.5–5.2)
SODIUM: 142 mmol/L (ref 134–144)
Total Protein: 7.8 g/dL (ref 6.0–8.5)

## 2017-08-14 LAB — LIPID PANEL
CHOLESTEROL TOTAL: 219 mg/dL — AB (ref 100–199)
Chol/HDL Ratio: 3.9 ratio (ref 0.0–5.0)
HDL: 56 mg/dL (ref >39)
LDL CALC: 146 mg/dL — AB (ref 0–99)
TRIGLYCERIDES: 84 mg/dL (ref 0–149)
VLDL CHOLESTEROL CAL: 17 mg/dL (ref 5–40)

## 2017-08-14 LAB — RPR: RPR Ser Ql: NONREACTIVE

## 2017-08-14 LAB — PSA: Prostate Specific Ag, Serum: 1.2 ng/mL (ref 0.0–4.0)

## 2017-08-14 LAB — HIV ANTIBODY (ROUTINE TESTING W REFLEX): HIV SCREEN 4TH GENERATION: NONREACTIVE

## 2017-08-15 LAB — GC/CHLAMYDIA PROBE AMP
CHLAMYDIA, DNA PROBE: NEGATIVE
NEISSERIA GONORRHOEAE BY PCR: NEGATIVE

## 2017-08-16 ENCOUNTER — Other Ambulatory Visit: Payer: Self-pay

## 2017-08-16 ENCOUNTER — Other Ambulatory Visit: Payer: Self-pay | Admitting: Physician Assistant

## 2017-08-16 ENCOUNTER — Encounter: Payer: Self-pay | Admitting: Physician Assistant

## 2017-08-16 ENCOUNTER — Ambulatory Visit: Payer: Self-pay | Admitting: Physician Assistant

## 2017-08-16 DIAGNOSIS — I10 Essential (primary) hypertension: Secondary | ICD-10-CM

## 2017-08-16 DIAGNOSIS — Z0289 Encounter for other administrative examinations: Secondary | ICD-10-CM

## 2017-08-16 MED ORDER — LOSARTAN POTASSIUM 50 MG PO TABS: 50.0000 mg | ORAL_TABLET | Freq: Every day | ORAL | 1 refills | Status: AC

## 2017-08-16 NOTE — Patient Instructions (Addendum)
Losartan tablets  What is this medicine?  LOSARTAN (loe SAR tan) is used to treat high blood pressure and to reduce the risk of stroke in certain patients. This drug also slows the progression of kidney disease in patients with diabetes.  This medicine may be used for other purposes; ask your health care provider or pharmacist if you have questions.  COMMON BRAND NAME(S): Cozaar  What should I tell my health care provider before I take this medicine?  They need to know if you have any of these conditions:  -heart failure  -kidney or liver disease  -an unusual or allergic reaction to losartan, other medicines, foods, dyes, or preservatives  -pregnant or trying to get pregnant  -breast-feeding  How should I use this medicine?  Take this medicine by mouth with a glass of water. Follow the directions on the prescription label. This medicine can be taken with or without food. Take your doses at regular intervals. Do not take your medicine more often than directed.  Talk to your pediatrician regarding the use of this medicine in children. Special care may be needed.  Overdosage: If you think you have taken too much of this medicine contact a poison control center or emergency room at once.  NOTE: This medicine is only for you. Do not share this medicine with others.  What if I miss a dose?  If you miss a dose, take it as soon as you can. If it is almost time for your next dose, take only that dose. Do not take double or extra doses.  What may interact with this medicine?  -blood pressure medicines  -diuretics, especially triamterene, spironolactone, or amiloride  -fluconazole  -NSAIDs, medicines for pain and inflammation, like ibuprofen or naproxen  -potassium salts or potassium supplements  -rifampin  This list may not describe all possible interactions. Give your health care provider a list of all the medicines, herbs, non-prescription drugs, or dietary supplements you use. Also tell them if you smoke, drink alcohol, or  use illegal drugs. Some items may interact with your medicine.  What should I watch for while using this medicine?  Visit your doctor or health care professional for regular checks on your progress. Check your blood pressure as directed. Ask your doctor or health care professional what your blood pressure should be and when you should contact him or her. Call your doctor or health care professional if you notice an irregular or fast heart beat.  Women should inform their doctor if they wish to become pregnant or think they might be pregnant. There is a potential for serious side effects to an unborn child, particularly in the second or third trimester. Talk to your health care professional or pharmacist for more information.  You may get drowsy or dizzy. Do not drive, use machinery, or do anything that needs mental alertness until you know how this drug affects you. Do not stand or sit up quickly, especially if you are an older patient. This reduces the risk of dizzy or fainting spells. Alcohol can make you more drowsy and dizzy. Avoid alcoholic drinks.  Avoid salt substitutes unless you are told otherwise by your doctor or health care professional.  Do not treat yourself for coughs, colds, or pain while you are taking this medicine without asking your doctor or health care professional for advice. Some ingredients may increase your blood pressure.  What side effects may I notice from receiving this medicine?  Side effects that you should report to   irregular heart beat, palpitations, or chest pain -skin rash, itching -swelling of your face, lips, tongue, hands, or feet Side effects that usually do not require medical attention (report to your doctor or health care  professional if they continue or are bothersome): -cough -decreased sexual function or desire -headache -nasal congestion or stuffiness -nausea or stomach pain -sore or cramping muscles This list may not describe all possible side effects. Call your doctor for medical advice about side effects. You may report side effects to FDA at 1-800-FDA-1088. Where should I keep my medicine? Keep out of the reach of children. Store at room temperature between 15 and 30 degrees C (59 and 86 degrees F). Protect from light. Keep container tightly closed. Throw away any unused medicine after the expiration date. NOTE: This sheet is a summary. It may not cover all possible information. If you have questions about this medicine, talk to your doctor, pharmacist, or health care provider.  2018 Elsevier/Gold Standard (2007-11-06 16:42:18)     IF you received an x-ray today, you will receive an invoice from [facility]. Please contact [facility] at [phone] with questions or concerns regarding your invoice.   IF you received labwork today, you will receive an invoice from [facility]. Please contact [facility] at [phone] with questions or concerns regarding your invoice.   Our billing staff will not be able to assist you with questions regarding bills from these companies.  You will be contacted with the lab results as soon as they are available. The fastest way to get your results is to activate your My Chart account. Instructions are located on the last page of this paperwork. If you have not heard from us regarding the results in 2 weeks, please contact this office.

## 2017-08-16 NOTE — Progress Notes (Signed)
[facility] [address], [place] [address] 336 [phone]  Date:  08/16/2017   Name:  Gary CalkinsSteven F Miller   DOB:  1981-01-17   MRN:  [id number]  PCP:  Garnetta BuddyEnglish, Stephanie D, PA    History of Present Illness:  Gary Miller is a 36 y.o. male patient who presents to PCP with  Chief Complaint  Patient presents with  . Employment Physical    DOT     No complaints or concerns.  He has never had a problem with blood pressure but is elevated today.    Patient Active Problem List   Diagnosis Date Noted  . Pain in left wrist 07/08/2017  . Liver laceration 04/28/2017  . Prostatitis, chronic 04/07/2014  . FHx: prostate cancer 04/07/2014    Past Medical History:  Diagnosis Date  . Hypertension     Past Surgical History:  Procedure Laterality Date  . SHOULDER SURGERY Right     Social History   Tobacco Use  . Smoking status: Never Smoker  . Smokeless tobacco: Never Used  Substance Use Topics  . Alcohol use: Yes    Comment: occasional  . Drug use: No    Family History  Problem Relation Age of Onset  . Hypertension Mother   . Hypertension Father   . Hyperlipidemia Father   . Cancer Father   . Hypertension Maternal Grandmother   . Hypertension Paternal Grandmother     No Known Allergies  Medication list has been reviewed and updated.  Current Outpatient Medications on File Prior to Visit  Medication Sig Dispense Refill  . ibuprofen (ADVIL,MOTRIN) 600 MG tablet Take 1 tablet (600 mg total) by mouth every 6 (six) hours as needed. 30 tablet 0  . pseudoephedrine (SUDAFED) 30 MG tablet Take 1 tablet (30 mg total) by mouth every 6 (six) hours as needed for congestion. 30 tablet 0   No current facility-administered medications on file prior to visit.     Review of Systems  Constitutional: Negative for chills and fever.  HENT: Negative for ear discharge, ear pain and sore throat.   Eyes: Negative for blurred vision and double vision.  Respiratory:  Negative for cough, shortness of breath and wheezing.   Cardiovascular: Negative for chest pain, palpitations and leg swelling.  Gastrointestinal: Negative for diarrhea, nausea and vomiting.  Genitourinary: Negative for dysuria, frequency and hematuria.  Skin: Negative for itching and rash.  Neurological: Negative for dizziness and headaches.   ROS otherwise unremarkable unless listed above.  Physical Examination: BP (!) 162/90   Pulse 96   Temp 97.8 F (36.6 C) (Oral)   Resp 16   Ht 6\' 2"  (1.88 m)   Wt 213 lb (96.6 kg)   SpO2 99%   BMI 27.35 kg/m  Ideal Body Weight: Weight in (lb) to have BMI = 25: 194.3  Physical Exam  Constitutional: He is oriented to person, place, and time. He appears well-developed and well-nourished. No distress.  HENT:  Head: Normocephalic and atraumatic.  Right Ear: Tympanic membrane, external ear and ear canal normal.  Left Ear: Tympanic membrane, external ear and ear canal normal.  Eyes: Conjunctivae and EOM are normal. Pupils are equal, round, and reactive to light.  Cardiovascular: Normal rate and regular rhythm. Exam reveals no friction rub.  No murmur heard. Pulmonary/Chest: Effort normal. No respiratory distress. He has no wheezes.  Abdominal: Soft. Bowel sounds are normal. He exhibits no distension and no mass. There is no tenderness. Hernia confirmed negative in  the right inguinal area and confirmed negative in the left inguinal area.  Musculoskeletal: Normal range of motion. He exhibits no edema or tenderness.  Neurological: He is alert and oriented to person, place, and time. He displays normal reflexes.  Skin: Skin is warm and dry. He is not diaphoretic.  Psychiatric: He has a normal mood and affect. His behavior is normal.     Assessment and Plan: Gary Miller is a 36 y.o. male who is here today for cc of dot. 3 month given at this time.  He is to return to have bp rechecked once this is brought to his pcp.   Encounter for  examination required by Department of Transportation (DOT)  Trena PlattStephanie English, PA-C [facility] 12/9/20184:01 PM

## 2017-08-19 ENCOUNTER — Encounter: Payer: Self-pay | Admitting: Occupational Therapy

## 2017-08-21 ENCOUNTER — Encounter: Payer: Self-pay | Admitting: Occupational Therapy

## 2017-08-25 ENCOUNTER — Ambulatory Visit: Payer: Self-pay | Admitting: Physician Assistant

## 2017-08-25 ENCOUNTER — Encounter: Payer: Self-pay | Admitting: Physician Assistant

## 2017-08-25 ENCOUNTER — Other Ambulatory Visit: Payer: Self-pay

## 2017-08-25 VITALS — BP 128/86 | HR 93 | Temp 98.7°F | Resp 16 | Ht 74.0 in | Wt 212.0 lb

## 2017-08-25 DIAGNOSIS — R0602 Shortness of breath: Secondary | ICD-10-CM

## 2017-08-25 DIAGNOSIS — I10 Essential (primary) hypertension: Secondary | ICD-10-CM

## 2017-08-25 MED ORDER — AMLODIPINE BESYLATE 5 MG PO TABS: 5.0000 mg | ORAL_TABLET | Freq: Every day | ORAL | 3 refills | Status: AC

## 2017-08-25 NOTE — Progress Notes (Signed)
[facility] [address], [place] [address] [phone]  Date:  08/25/2017   Name:  Gary CalkinsSteven F Miller   DOB:  28-Sep-1980   MRN:  [id number]  PCP:  Garnetta BuddyEnglish, Stephanie D, PA    History of Present Illness:  Gary CalkinsSteven F Miller is a 36 y.o. male patient who presents to PCP with  Chief Complaint  Patient presents with  . Follow-up    HTN/ pt states the med he is on makes him feel SOB.     --patient has been taking the losartan for the last 2 weeks.  Reports that he has noticed since the start of the medications that this has caused some sob and chest tightness.  The symptoms occur at rest or with exertion.  He has noted no palpitations or diaphoresis with his symptoms.  No nausea.  No oral swellings.    Patient Active Problem List   Diagnosis Date Noted  . Pain in left wrist 07/08/2017  . Liver laceration 04/28/2017  . Prostatitis, chronic 04/07/2014  . FHx: prostate cancer 04/07/2014    Past Medical History:  Diagnosis Date  . Hypertension     Past Surgical History:  Procedure Laterality Date  . SHOULDER SURGERY Right     Social History   Tobacco Use  . Smoking status: Never Smoker  . Smokeless tobacco: Never Used  Substance Use Topics  . Alcohol use: Yes    Comment: occasional  . Drug use: No    Family History  Problem Relation Age of Onset  . Hypertension Mother   . Hypertension Father   . Hyperlipidemia Father   . Cancer Father   . Hypertension Maternal Grandmother   . Hypertension Paternal Grandmother     No Known Allergies  Medication list has been reviewed and updated.  Current Outpatient Medications on File Prior to Visit  Medication Sig Dispense Refill  . ibuprofen (ADVIL,MOTRIN) 600 MG tablet Take 1 tablet (600 mg total) by mouth every 6 (six) hours as needed. 30 tablet 0  . losartan (COZAAR) 50 MG tablet Take 1 tablet (50 mg total) by mouth daily. 30 tablet 1  . pseudoephedrine (SUDAFED) 30 MG tablet Take 1 tablet (30 mg total) by  mouth every 6 (six) hours as needed for congestion. 30 tablet 0   No current facility-administered medications on file prior to visit.     ROS ROS otherwise unremarkable unless listed above.  Physical Examination: BP 128/86   Pulse 93   Temp 98.7 F (37.1 C) (Oral)   Resp 16   Ht 6\' 2"  (1.88 m)   Wt 212 lb (96.2 kg)   SpO2 98%   BMI 27.22 kg/m  Ideal Body Weight: Weight in (lb) to have BMI = 25: 194.3  Physical Exam  Constitutional: He is oriented to person, place, and time. He appears well-developed and well-nourished. No distress.  HENT:  Head: Normocephalic and atraumatic.  Eyes: Conjunctivae and EOM are normal. Pupils are equal, round, and reactive to light.  Cardiovascular: Normal rate, regular rhythm and intact distal pulses. Exam reveals no friction rub.  No murmur heard. Pulses:      Radial pulses are 2+ on the right side, and 2+ on the left side.       Dorsalis pedis pulses are 2+ on the right side, and 2+ on the left side.  Pulmonary/Chest: Effort normal. No respiratory distress.  Neurological: He is alert and oriented to person, place, and time.  Skin: Skin is warm  and dry. He is not diaphoretic.  Psychiatric: He has a normal mood and affect. His behavior is normal.     Assessment and Plan: Gary CalkinsSteven F Hatlestad is a 36 y.o. male who is here today for cc of  Chief Complaint  Patient presents with  . Follow-up    HTN/ pt states the med he is on makes him feel SOB.   Patient will stop the losartan.  Starting amlodipine.  Follow up in 2-4 weeks for recheck.  Discussed lab results and appropriate diet.   Essential hypertension - Plan: amLODipine (NORVASC) 5 MG tablet  SOB (shortness of breath) - Plan: EKG 12-Lead  Trena PlattStephanie English, PA-C [facility] 12/26/20189:40 PM

## 2017-08-25 NOTE — Patient Instructions (Addendum)
DASH Eating Plan DASH stands for "Dietary Approaches to Stop Hypertension." The DASH eating plan is a healthy eating plan that has been shown to reduce high blood pressure (hypertension). It may also reduce your risk for type 2 diabetes, heart disease, and stroke. The DASH eating plan may also help with weight loss. What are tips for following this plan? General guidelines  Avoid eating more than 2,300 mg (milligrams) of salt (sodium) a day. If you have hypertension, you may need to reduce your sodium intake to 1,500 mg a day.  Limit alcohol intake to no more than 1 drink a day for nonpregnant women and 2 drinks a day for men. One drink equals 12 oz of beer, 5 oz of wine, or 1 oz of hard liquor.  Work with your health care provider to maintain a healthy body weight or to lose weight. Ask what an ideal weight is for you.  Get at least 30 minutes of exercise that causes your heart to beat faster (aerobic exercise) most days of the week. Activities may include walking, swimming, or biking.  Work with your health care provider or diet and nutrition specialist (dietitian) to adjust your eating plan to your individual calorie needs. Reading food labels  Check food labels for the amount of sodium per serving. Choose foods with less than 5 percent of the Daily Value of sodium. Generally, foods with less than 300 mg of sodium per serving fit into this eating plan.  To find whole grains, look for the word "whole" as the first word in the ingredient list. Shopping  Buy products labeled as "low-sodium" or "no salt added."  Buy fresh foods. Avoid canned foods and premade or frozen meals. Cooking  Avoid adding salt when cooking. Use salt-free seasonings or herbs instead of table salt or sea salt. Check with your health care provider or pharmacist before using salt substitutes.  Do not fry foods. Cook foods using healthy methods such as baking, boiling, grilling, and broiling instead.  Cook with  heart-healthy oils, such as olive, canola, soybean, or sunflower oil. Meal planning   Eat a balanced diet that includes: ? 5 or more servings of fruits and vegetables each day. At each meal, try to fill half of your plate with fruits and vegetables. ? Up to 6-8 servings of whole grains each day. ? Less than 6 oz of lean meat, poultry, or fish each day. A 3-oz serving of meat is about the same size as a deck of cards. One egg equals 1 oz. ? 2 servings of low-fat dairy each day. ? A serving of nuts, seeds, or beans 5 times each week. ? Heart-healthy fats. Healthy fats called Omega-3 fatty acids are found in foods such as flaxseeds and coldwater fish, like sardines, salmon, and mackerel.  Limit how much you eat of the following: ? Canned or prepackaged foods. ? Food that is high in trans fat, such as fried foods. ? Food that is high in saturated fat, such as fatty meat. ? Sweets, desserts, sugary drinks, and other foods with added sugar. ? Full-fat dairy products.  Do not salt foods before eating.  Try to eat at least 2 vegetarian meals each week.  Eat more home-cooked food and less restaurant, buffet, and fast food.  When eating at a restaurant, ask that your food be prepared with less salt or no salt, if possible. What foods are recommended? The items listed may not be a complete list. Talk with your dietitian about what   dietary choices are best for you. Grains Whole-grain or whole-wheat bread. Whole-grain or whole-wheat pasta. Brown rice. Oatmeal. Quinoa. Bulgur. Whole-grain and low-sodium cereals. Pita bread. Low-fat, low-sodium crackers. Whole-wheat flour tortillas. Vegetables Fresh or frozen vegetables (raw, steamed, roasted, or grilled). Low-sodium or reduced-sodium tomato and vegetable juice. Low-sodium or reduced-sodium tomato sauce and tomato paste. Low-sodium or reduced-sodium canned vegetables. Fruits All fresh, dried, or frozen fruit. Canned fruit in natural juice (without  added sugar). Meat and other protein foods Skinless chicken or turkey. Ground chicken or turkey. Pork with fat trimmed off. Fish and seafood. Egg whites. Dried beans, peas, or lentils. Unsalted nuts, nut butters, and seeds. Unsalted canned beans. Lean cuts of beef with fat trimmed off. Low-sodium, lean deli meat. Dairy Low-fat (1%) or fat-free (skim) milk. Fat-free, low-fat, or reduced-fat cheeses. Nonfat, low-sodium ricotta or cottage cheese. Low-fat or nonfat yogurt. Low-fat, low-sodium cheese. Fats and oils Soft margarine without trans fats. Vegetable oil. Low-fat, reduced-fat, or light mayonnaise and salad dressings (reduced-sodium). Canola, safflower, olive, soybean, and sunflower oils. Avocado. Seasoning and other foods Herbs. Spices. Seasoning mixes without salt. Unsalted popcorn and pretzels. Fat-free sweets. What foods are not recommended? The items listed may not be a complete list. Talk with your dietitian about what dietary choices are best for you. Grains Baked goods made with fat, such as croissants, muffins, or some breads. Dry pasta or rice meal packs. Vegetables Creamed or fried vegetables. Vegetables in a cheese sauce. Regular canned vegetables (not low-sodium or reduced-sodium). Regular canned tomato sauce and paste (not low-sodium or reduced-sodium). Regular tomato and vegetable juice (not low-sodium or reduced-sodium). Pickles. Olives. Fruits Canned fruit in a light or heavy syrup. Fried fruit. Fruit in cream or butter sauce. Meat and other protein foods Fatty cuts of meat. Ribs. Fried meat. Bacon. Sausage. Bologna and other processed lunch meats. Salami. Fatback. Hotdogs. Bratwurst. Salted nuts and seeds. Canned beans with added salt. Canned or smoked fish. Whole eggs or egg yolks. Chicken or turkey with skin. Dairy Whole or 2% milk, cream, and half-and-half. Whole or full-fat cream cheese. Whole-fat or sweetened yogurt. Full-fat cheese. Nondairy creamers. Whipped toppings.  Processed cheese and cheese spreads. Fats and oils Butter. Stick margarine. Lard. Shortening. Ghee. Bacon fat. Tropical oils, such as coconut, palm kernel, or palm oil. Seasoning and other foods Salted popcorn and pretzels. Onion salt, garlic salt, seasoned salt, table salt, and sea salt. Worcestershire sauce. Tartar sauce. Barbecue sauce. Teriyaki sauce. Soy sauce, including reduced-sodium. Steak sauce. Canned and packaged gravies. Fish sauce. Oyster sauce. Cocktail sauce. Horseradish that you find on the shelf. Ketchup. Mustard. Meat flavorings and tenderizers. Bouillon cubes. Hot sauce and Tabasco sauce. Premade or packaged marinades. Premade or packaged taco seasonings. Relishes. Regular salad dressings. Where to find more information:  National Heart, Lung, and Blood Institute: www.nhlbi.nih.gov  American Heart Association: www.heart.org Summary  The DASH eating plan is a healthy eating plan that has been shown to reduce high blood pressure (hypertension). It may also reduce your risk for type 2 diabetes, heart disease, and stroke.  With the DASH eating plan, you should limit salt (sodium) intake to 2,300 mg a day. If you have hypertension, you may need to reduce your sodium intake to 1,500 mg a day.  When on the DASH eating plan, aim to eat more fresh fruits and vegetables, whole grains, lean proteins, low-fat dairy, and heart-healthy fats.  Work with your health care provider or diet and nutrition specialist (dietitian) to adjust your eating plan to your individual   calorie needs. This information is not intended to replace advice given to you by your health care provider. Make sure you discuss any questions you have with your health care provider. Document Released: 08/15/2011 Document Revised: 08/19/2016 Document Reviewed: 08/19/2016 Elsevier Interactive Patient Education  2017 Elsevier Inc.  Amlodipine tablets What is this medicine? AMLODIPINE (am LOE di peen) is a calcium-channel  blocker. It affects the amount of calcium found in your heart and muscle cells. This relaxes your blood vessels, which can reduce the amount of work the heart has to do. This medicine is used to lower high blood pressure. It is also used to prevent chest pain. This medicine may be used for other purposes; ask your health care provider or pharmacist if you have questions. COMMON BRAND NAME(S): Norvasc What should I tell my health care provider before I take this medicine? They need to know if you have any of these conditions: -heart problems like heart failure or aortic stenosis -liver disease -an unusual or allergic reaction to amlodipine, other medicines, foods, dyes, or preservatives -pregnant or trying to get pregnant -breast-feeding How should I use this medicine? Take this medicine by mouth with a glass of water. Follow the directions on the prescription label. Take your medicine at regular intervals. Do not take more medicine than directed. Talk to your pediatrician regarding the use of this medicine in children. Special care may be needed. This medicine has been used in children as young as 6. Persons over 38 years old may have a stronger reaction to this medicine and need smaller doses. Overdosage: If you think you have taken too much of this medicine contact a poison control center or emergency room at once. NOTE: This medicine is only for you. Do not share this medicine with others. What if I miss a dose? If you miss a dose, take it as soon as you can. If it is almost time for your next dose, take only that dose. Do not take double or extra doses. What may interact with this medicine? -herbal or dietary supplements -local or general anesthetics -medicines for high blood pressure -medicines for prostate problems -rifampin This list may not describe all possible interactions. Give your health care provider a list of all the medicines, herbs, non-prescription drugs, or dietary  supplements you use. Also tell them if you smoke, drink alcohol, or use illegal drugs. Some items may interact with your medicine. What should I watch for while using this medicine? Visit your doctor or health care professional for regular check ups. Check your blood pressure and pulse rate regularly. Ask your health care professional what your blood pressure and pulse rate should be, and when you should contact him or her. This medicine may make you feel confused, dizzy or lightheaded. Do not drive, use machinery, or do anything that needs mental alertness until you know how this medicine affects you. To reduce the risk of dizzy or fainting spells, do not sit or stand up quickly, especially if you are an older patient. Avoid alcoholic drinks; they can make you more dizzy. Do not suddenly stop taking amlodipine. Ask your doctor or health care professional how you can gradually reduce the dose. What side effects may I notice from receiving this medicine? Side effects that you should report to your doctor or health care professional as soon as possible: -allergic reactions like skin rash, itching or hives, swelling of the face, lips, or tongue -breathing problems -changes in vision or hearing -chest pain -fast, irregular  heartbeat -swelling of legs or ankles Side effects that usually do not require medical attention (report to your doctor or health care professional if they continue or are bothersome): -dry mouth -facial flushing -nausea, vomiting -stomach gas, pain -tired, weak -trouble sleeping This list may not describe all possible side effects. Call your doctor for medical advice about side effects. You may report side effects to FDA at 1-800-FDA-1088. Where should I keep my medicine? Keep out of the reach of children. Store at room temperature between 59 and 86 degrees F (15 and 30 degrees C). Protect from light. Keep container tightly closed. Throw away any unused medicine after the  expiration date. NOTE: This sheet is a summary. It may not cover all possible information. If you have questions about this medicine, talk to your doctor, pharmacist, or health care provider.  2018 Elsevier/Gold Standard (2012-07-24 11:40:58)  Fat and Cholesterol Restricted Diet Getting too much fat and cholesterol in your diet may cause health problems. Following this diet helps keep your fat and cholesterol at normal levels. This can keep you from getting sick. What types of fat should I choose?  Choose monosaturated and polyunsaturated fats. These are found in foods such as olive oil, canola oil, flaxseeds, walnuts, almonds, and seeds.  Eat more omega-3 fats. Good choices include salmon, mackerel, sardines, tuna, flaxseed oil, and ground flaxseeds.  Limit saturated fats. These are in animal products such as meats, butter, and cream. They can also be in plant products such as palm oil, palm kernel oil, and coconut oil.  Avoid foods with partially hydrogenated oils in them. These contain trans fats. Examples of foods that have trans fats are stick margarine, some tub margarines, cookies, crackers, and other baked goods. What general guidelines do I need to follow?  Check food labels. Look for the words "trans fat" and "saturated fat."  When preparing a meal: ? Fill half of your plate with vegetables and green salads. ? Fill one fourth of your plate with whole grains. Look for the word "whole" as the first word in the ingredient list. ? Fill one fourth of your plate with lean protein foods.  Eat more foods that have fiber, like apples, carrots, beans, peas, and barley.  Eat more home-cooked foods. Eat less at restaurants and buffets.  Limit or avoid alcohol.  Limit foods high in starch and sugar.  Limit fried foods.  Cook foods without frying them. Baking, boiling, grilling, and broiling are all great options.  Lose weight if you are overweight. Losing even a small amount of  weight can help your overall health. It can also help prevent diseases such as diabetes and heart disease. What foods can I eat? Grains Whole grains, such as whole wheat or whole grain breads, crackers, cereals, and pasta. Unsweetened oatmeal, bulgur, barley, quinoa, or brown rice. Corn or whole wheat flour tortillas. Vegetables Fresh or frozen vegetables (raw, steamed, roasted, or grilled). Green salads. Fruits All fresh, canned (in natural juice), or frozen fruits. Meat and Other Protein Products Ground beef (85% or leaner), grass-fed beef, or beef trimmed of fat. Skinless chicken or [place]. Ground chicken or [place]. Pork trimmed of fat. All fish and seafood. Eggs. Dried beans, peas, or lentils. Unsalted nuts or seeds. Unsalted canned or dry beans. Dairy Low-fat dairy products, such as skim or 1% milk, 2% or reduced-fat cheeses, low-fat ricotta or cottage cheese, or plain low-fat yogurt. Fats and Oils Tub margarines without trans fats. Light or reduced-fat mayonnaise and salad dressings. Avocado.  Olive, canola, sesame, or safflower oils. Natural peanut or almond butter (choose ones without added sugar and oil). The items listed above may not be a complete list of recommended foods or beverages. Contact your dietitian for more options. What foods are not recommended? Grains White bread. White pasta. White rice. Cornbread. Bagels, pastries, and croissants. Crackers that contain trans fat. Vegetables White potatoes. Corn. Creamed or fried vegetables. Vegetables in a cheese sauce. Fruits Dried fruits. Canned fruit in light or heavy syrup. Fruit juice. Meat and Other Protein Products Fatty cuts of meat. Ribs, chicken wings, bacon, sausage, bologna, salami, chitterlings, fatback, hot dogs, bratwurst, and packaged luncheon meats. Liver and organ meats. Dairy Whole or 2% milk, cream, half-and-half, and cream cheese. Whole milk cheeses. Whole-fat or sweetened yogurt. Full-fat cheeses. Nondairy  creamers and whipped toppings. Processed cheese, cheese spreads, or cheese curds. Sweets and Desserts Corn syrup, sugars, honey, and molasses. Candy. Jam and jelly. Syrup. Sweetened cereals. Cookies, pies, cakes, donuts, muffins, and ice cream. Fats and Oils Butter, stick margarine, lard, shortening, ghee, or bacon fat. Coconut, palm kernel, or palm oils. Beverages Alcohol. Sweetened drinks (such as sodas, lemonade, and fruit drinks or punches). The items listed above may not be a complete list of foods and beverages to avoid. Contact your dietitian for more information. This information is not intended to replace advice given to you by your health care provider. Make sure you discuss any questions you have with your health care provider. Document Released: 02/25/2012 Document Revised: 05/02/2016 Document Reviewed: 11/25/2013 Elsevier Interactive Patient Education  2018 [employer].    IF you received an x-ray today, you will receive an invoice from [facility]. Please contact [facility] at [phone] with questions or concerns regarding your invoice.   IF you received labwork today, you will receive an invoice from [facility]. Please contact [facility] at [phone] with questions or concerns regarding your invoice.   Our billing staff will not be able to assist you with questions regarding bills from these companies.  You will be contacted with the lab results as soon as they are available. The fastest way to get your results is to activate your My Chart account. Instructions are located on the last page of this paperwork. If you have not heard from [place] regarding the results in 2 weeks, please contact this office.

## 2017-08-26 ENCOUNTER — Encounter: Payer: Self-pay | Admitting: Occupational Therapy

## 2017-08-28 ENCOUNTER — Encounter: Payer: Self-pay | Admitting: Occupational Therapy

## 2017-12-17 ENCOUNTER — Encounter: Payer: Self-pay | Admitting: Physician Assistant

## 2017-12-23 ENCOUNTER — Encounter: Payer: Self-pay | Admitting: Neurology

## 2017-12-23 ENCOUNTER — Encounter: Payer: Self-pay | Admitting: Physician Assistant
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
## 2022-01-14 ENCOUNTER — Emergency Department (HOSPITAL_COMMUNITY)
Admission: EM | Admit: 2022-01-14 | Discharge: 2022-01-14 | Disposition: A | Discharge status: Home / Self Care | Payer: Self-pay | Attending: Emergency Medicine | Admitting: Emergency Medicine

## 2022-01-14 ENCOUNTER — Emergency Department (HOSPITAL_BASED_OUTPATIENT_CLINIC_OR_DEPARTMENT_OTHER): Admit: 2022-01-14 | Discharge: 2022-01-14 | Disposition: A | Discharge status: Home / Self Care | Payer: Self-pay

## 2022-01-14 ENCOUNTER — Encounter (HOSPITAL_COMMUNITY): Payer: Self-pay | Admitting: Emergency Medicine

## 2022-01-14 DIAGNOSIS — M79605 Pain in left leg: Secondary | ICD-10-CM

## 2022-01-14 DIAGNOSIS — I10 Essential (primary) hypertension: Secondary | ICD-10-CM | POA: Insufficient documentation

## 2022-01-14 DIAGNOSIS — Z79899 Other long term (current) drug therapy: Secondary | ICD-10-CM | POA: Insufficient documentation

## 2022-01-14 DIAGNOSIS — M79662 Pain in left lower leg: Secondary | ICD-10-CM | POA: Insufficient documentation

## 2022-01-14 NOTE — ED Provider Triage Note (Signed)
Emergency Medicine Provider Triage Evaluation Note ? ?Gary Miller , a 41 y.o. male  was evaluated in triage.  Pt complains of pain to left calf.  Pain started earlier today.  Pain has been constant throughout the day.  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 ? ?Denies any chest pain, shortness of breath, numbness, weakness, leg swelling. ? ?Review of Systems  ?Positive: Pain to left calf ?Negative: See above ? ?Physical Exam  ?BP (!) 160/100 (BP Location: Right Arm)   Pulse 80   Temp 99.3 ?F (37.4 ?C) (Oral)   Resp 20   Ht 6\' 3"  (1.905 m)   Wt 97.5 kg   SpO2 98%   BMI 26.87 kg/m?  ?Gen:   Awake, no distress   ?Resp:  Normal effort  ?MSK:   Moves extremities without difficulty  ?Other:  Tenderness to left calf.  No swelling to bilateral lower extremities.  +2 left DP pulse.  Sensation intact to all digits of left foot. ? ?Medical Decision Making  ?Medically screening exam initiated at 5:48 PM.  Appropriate orders placed.  MERRIC ANSARI was informed that the remainder of the evaluation will be completed by another provider, this initial triage assessment does not replace that evaluation, and the importance of remaining in the ED until their evaluation is complete. ? ? ?  ?Loni Beckwith, PA-C ?01/14/22 1751 ? ?

## 2022-01-14 NOTE — Progress Notes (Signed)
Left lower extremity venous duplex has been completed. ?Preliminary results can be found in CV Proc through chart review.  ?Results were given to [facility]. ? ?01/14/22 7:02 PM ?Olen Cordial RVT   ?

## 2022-01-14 NOTE — Discharge Instructions (Signed)
You came to the emergency department today to be evaluated for your left calf pain.  The ultrasound obtained did not show any blood clots.  Your pain is likely musculoskeletal in nature and should improve over time.  Please take Tylenol and ibuprofen as indicated below. ? ?Please take Ibuprofen (Advil, motrin) and Tylenol (acetaminophen) to relieve your pain.   ? ?You may take up to 600 MG (3 pills) of normal strength ibuprofen every 8 hours as needed.   ?You make take tylenol, up to 1,000 mg (two extra strength pills) every 8 hours as needed.  ? ?It is safe to take ibuprofen and tylenol at the same time as they work differently.  ? Do not take more than 3,000 mg tylenol in a 24 hour period (not more than one dose every 8 hours.  Please check all medication labels as many medications such as pain and cold medications may contain tylenol.  Do not drink alcohol while taking these medications.  Do not take other NSAID'S while taking ibuprofen (such as aleve or naproxen).  Please take ibuprofen with food to decrease stomach upset. ? ?Please return to the emergency department if: ?-Your foot becomes cold, numb, or blue. ?-You develop warmth and redness to your lower extremity ?

## 2022-01-14 NOTE — ED Triage Notes (Signed)
Pt endorses left calf pain that started this morning, no swelling. Denies CP or SOB.  ?

## 2022-01-14 NOTE — ED Provider Notes (Signed)
?[facility] EMERGENCY DEPARTMENT ?Provider Note ? ? ?CSN: [id number] ?Arrival date & time: 01/14/22  1726 ? ?  ? ?History ? ?Chief Complaint  ?Patient presents with  ? Leg Pain  ? ? ?Gary Miller is a 41 y.o. male with pertinent history of hypertension.  Presents emergency department complaint of pain to the left calf.  Patient reports that pain started upon waking this morning and has been consistent since then.  Pain is located to his left calf and does not radiate.  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 ? ?atient denies any swelling, numbness, weakness, color change, wounds, chest pain, shortness of breath, hemoptysis, injury. ? ? ?Leg Pain ?Associated symptoms: no fever   ? ?  ? ?Home Medications ?Prior to Admission medications   ?Medication Sig Start Date End Date Taking? Authorizing Provider  ?amLODipine (NORVASC) 5 MG tablet Take 1 tablet (5 mg total) by mouth daily. 08/25/17   Trena Platt D, PA  ?ibuprofen (ADVIL,MOTRIN) 600 MG tablet Take 1 tablet (600 mg total) by mouth every 6 (six) hours as needed. 08/02/17   Janne Napoleon, NP  ?losartan (COZAAR) 50 MG tablet Take 1 tablet (50 mg total) by mouth daily. 08/16/17   Trena Platt D, PA  ?pseudoephedrine (SUDAFED) 30 MG tablet Take 1 tablet (30 mg total) by mouth every 6 (six) hours as needed for congestion. 08/02/17   Janne Napoleon, NP  ?   ? ?Allergies    ?Patient has no known allergies.   ? ?Review of Systems   ?Review of Systems  ?Constitutional:  Negative for chills and fever.  ?Respiratory:  Negative for shortness of breath.   ?Cardiovascular:  Negative for chest pain.  ?Musculoskeletal:  Positive for myalgias.  ?Skin:  Negative for color change, pallor, rash and wound.  ?Neurological:  Negative for weakness and numbness.  ?Psychiatric/Behavioral:  Negative for confusion.   ? ?Physical Exam ?Updated Vital Signs ?BP (!) 160/100  (BP Location: Right Arm)   Pulse 80   Temp 99.3 ?F (37.4 ?C) (Oral)   Resp 20   Ht 6\' 3"  (1.905 m)   Wt 97.5 kg   SpO2 98%   BMI 26.87 kg/m?  ?Physical Exam ?Vitals and nursing note reviewed.  ?Constitutional:   ?   General: He is not in acute distress. ?   Appearance: He is not ill-appearing, toxic-appearing or diaphoretic.  ?HENT:  ?   Head: Normocephalic.  ?Eyes:  ?   General: No scleral icterus.    ?   Right eye: No discharge.     ?   Left eye: No discharge.  ?Cardiovascular:  ?   Rate and Rhythm: Normal rate.  ?   Pulses:     ?     Dorsalis pedis pulses are 2+ on the left side.  ?Pulmonary:  ?   Effort: Pulmonary effort is normal.  ?Musculoskeletal:  ?   Right knee: No swelling, deformity, effusion, erythema, ecchymosis, lacerations, bony tenderness or crepitus. Normal range of motion. No tenderness. Normal alignment.  ?   Left knee: No swelling, deformity, effusion, erythema, ecchymosis, lacerations, bony tenderness or crepitus. Normal range of motion. No tenderness. Normal alignment.  ?   Right lower leg: Normal.  ?   Left lower leg: Tenderness present. No swelling, deformity, lacerations or bony tenderness. No edema.  ?   Right ankle: No swelling, deformity,  ecchymosis or lacerations. No tenderness. Normal range of motion.  ?   Left ankle: No swelling, deformity, ecchymosis or lacerations. No tenderness. Normal range of motion.  ?   Left foot: Normal range of motion and normal capillary refill. No swelling, deformity, laceration, tenderness, bony tenderness or crepitus. Normal pulse.  ?Skin: ?   General: Skin is warm and dry.  ?Neurological:  ?   General: No focal deficit present.  ?   Mental Status: He is alert.  ?Psychiatric:     ?   Behavior: Behavior is cooperative.  ? ? ?ED Results / Procedures / Treatments   ?Labs ?(all labs ordered are listed, but only abnormal results are displayed) ?Labs Reviewed - No data to display ? ?EKG ?None ? ?Radiology ?No results found. ? ?Procedures ?Procedures   ? ? ?Medications Ordered in ED ?Medications - No data to display ? ?ED Course/ Medical Decision Making/ A&P ?  ?                        ?Medical Decision Making ? ?Alert 41 year old male no acute stress, nontoxic-appearing.  Presents emergency department the chief complaint of left calf pain. ? ?Information obtained from patient.  Past medical records were reviewed including previous provider notes, labs, and imaging.  Patient has history as outlined in HPI was complicates care. ? ?Due to patient's prolonged sitting and pain to left calf concern for possible DVT.  Will obtain ultrasound imaging to evaluate for possible DVT.  Pulse, motor, and sensation intact to left foot; low suspicion for arterial occlusion at this time. ? ?I was contacted by ultrasound technician who reports the patient does not have any DVT to left lower extremity. ? ?Suspect that patient's pain is musculoskeletal in nature.  Discussed symptomatic treatment with over-the-counter pain medication.  Patient to follow-up with PCP if pain does not improve. ? ?Based on patient's chief complaint, I considered admission might be necessary, however after reassuring ED workup feel patient is reasonable for discharge.  Discussed results, findings, treatment and follow up. Patient advised of return precautions. Patient verbalized understanding and agreed with plan. ? ?Portions of this note were generated with Scientist, clinical (histocompatibility and immunogenetics). Dictation errors may occur despite best attempts at proofreading. ? ? ? ? ? ? ? ? ?Final Clinical Impression(s) / ED Diagnoses ?Final diagnoses:  ?None  ? ? ?Rx / DC Orders ?ED Discharge Orders   ? ? None  ? ?  ? ? ?  ?Haskel Schroeder, PA-C ?01/14/22 1826 ? ?  ?Milagros Loll, MD ?01/14/22 2200 ? ?

## 2023-05-11 ENCOUNTER — Emergency Department (HOSPITAL_COMMUNITY)
Admission: EM | Admit: 2023-05-11 | Discharge: 2023-05-11 | Discharge status: Against Medical Advice | Payer: No Typology Code available for payment source | Source: Home / Self Care
